# Patient Record
Sex: Male | Born: 1980 | Race: Black or African American | Hispanic: No | Marital: Single
Health system: Midwestern US, Community
[De-identification: ages and names within clinical notes are randomized; demographics above are authoritative.]

## PROBLEM LIST (undated history)

## (undated) DIAGNOSIS — I10 Essential (primary) hypertension: Secondary | ICD-10-CM

## (undated) DIAGNOSIS — F319 Bipolar disorder, unspecified: Secondary | ICD-10-CM

## (undated) DIAGNOSIS — Z86718 Personal history of other venous thrombosis and embolism: Secondary | ICD-10-CM

## (undated) HISTORY — PX: OTHER SURGICAL HISTORY: SHX169

## (undated) HISTORY — PX: FASCIOTOMY: SHX132

---

## 2011-09-01 DIAGNOSIS — N529 Male erectile dysfunction, unspecified: Secondary | ICD-10-CM | POA: Insufficient documentation

## 2011-09-01 DIAGNOSIS — E669 Obesity, unspecified: Secondary | ICD-10-CM | POA: Insufficient documentation

## 2011-09-01 DIAGNOSIS — Z8639 Personal history of other endocrine, nutritional and metabolic disease: Secondary | ICD-10-CM | POA: Insufficient documentation

## 2011-09-01 DIAGNOSIS — Z720 Tobacco use: Secondary | ICD-10-CM | POA: Insufficient documentation

## 2011-09-01 DIAGNOSIS — M19071 Primary osteoarthritis, right ankle and foot: Secondary | ICD-10-CM | POA: Insufficient documentation

## 2014-11-02 DIAGNOSIS — F312 Bipolar disorder, current episode manic severe with psychotic features: Secondary | ICD-10-CM | POA: Insufficient documentation

## 2014-11-02 DIAGNOSIS — F1210 Cannabis abuse, uncomplicated: Secondary | ICD-10-CM | POA: Insufficient documentation

## 2014-11-17 ENCOUNTER — Emergency Department (HOSPITAL_COMMUNITY)
Admission: EM | Admit: 2014-11-17 | Discharge: 2014-11-18 | Disposition: A | Discharge status: Other Acute Inpatient Hospital | Payer: Medicare Other | Attending: Emergency Medicine | Admitting: Emergency Medicine

## 2014-11-17 ENCOUNTER — Encounter (HOSPITAL_COMMUNITY): Payer: Self-pay

## 2014-11-17 DIAGNOSIS — F22 Delusional disorders: Secondary | ICD-10-CM | POA: Diagnosis not present

## 2014-11-17 DIAGNOSIS — R45850 Homicidal ideations: Secondary | ICD-10-CM | POA: Diagnosis present

## 2014-11-17 DIAGNOSIS — F1210 Cannabis abuse, uncomplicated: Secondary | ICD-10-CM | POA: Insufficient documentation

## 2014-11-17 DIAGNOSIS — Z86718 Personal history of other venous thrombosis and embolism: Secondary | ICD-10-CM | POA: Insufficient documentation

## 2014-11-17 DIAGNOSIS — I10 Essential (primary) hypertension: Secondary | ICD-10-CM | POA: Diagnosis not present

## 2014-11-17 DIAGNOSIS — F319 Bipolar disorder, unspecified: Secondary | ICD-10-CM | POA: Diagnosis not present

## 2014-11-17 DIAGNOSIS — Z79899 Other long term (current) drug therapy: Secondary | ICD-10-CM | POA: Diagnosis not present

## 2014-11-17 HISTORY — DX: Essential (primary) hypertension: I10

## 2014-11-17 HISTORY — DX: Bipolar disorder, unspecified: F31.9

## 2014-11-17 HISTORY — DX: Personal history of other venous thrombosis and embolism: Z86.718

## 2014-11-17 LAB — CBC
HEMATOCRIT: 45.4 % (ref 39.0–52.0)
Hemoglobin: 14.7 g/dL (ref 13.0–17.0)
MCH: 30.4 pg (ref 26.0–34.0)
MCHC: 32.4 g/dL (ref 30.0–36.0)
MCV: 93.8 fL (ref 78.0–100.0)
Platelets: 166 10*3/uL (ref 150–400)
RBC: 4.84 MIL/uL (ref 4.22–5.81)
RDW: 13.7 % (ref 11.5–15.5)
WBC: 9.4 10*3/uL (ref 4.0–10.5)

## 2014-11-17 LAB — COMPREHENSIVE METABOLIC PANEL
ALT: 68 U/L — ABNORMAL HIGH (ref 0–53)
AST: 32 U/L (ref 0–37)
Albumin: 3.7 g/dL (ref 3.5–5.2)
Alkaline Phosphatase: 37 U/L — ABNORMAL LOW (ref 39–117)
Anion gap: 11 (ref 5–15)
BILIRUBIN TOTAL: 0.7 mg/dL (ref 0.3–1.2)
BUN: 9 mg/dL (ref 6–23)
CHLORIDE: 99 mmol/L (ref 96–112)
CO2: 30 mmol/L (ref 19–32)
Calcium: 8.6 mg/dL (ref 8.4–10.5)
Creatinine, Ser: 1.33 mg/dL (ref 0.50–1.35)
GFR calc Af Amer: 81 mL/min — ABNORMAL LOW (ref >90)
GFR, EST NON AFRICAN AMERICAN: 70 mL/min — AB (ref >90)
Glucose, Bld: 97 mg/dL (ref 70–99)
Potassium: 3.7 mmol/L (ref 3.5–5.1)
SODIUM: 140 mmol/L (ref 135–145)
Total Protein: 6.3 g/dL (ref 6.0–8.3)

## 2014-11-17 LAB — ACETAMINOPHEN LEVEL

## 2014-11-17 LAB — SALICYLATE LEVEL

## 2014-11-17 LAB — ETHANOL: Alcohol, Ethyl (B): <5 mg/dL (ref 0–9)

## 2014-11-17 MED ORDER — ACETAMINOPHEN 325 MG PO TABS: 650.0000 mg | ORAL_TABLET | ORAL | Status: DC | PRN

## 2014-11-17 MED ORDER — IBUPROFEN 400 MG PO TABS: 600.0000 mg | ORAL_TABLET | Freq: Three times a day (TID) | ORAL | Status: DC | PRN

## 2014-11-17 NOTE — ED Notes (Signed)
Pt recent dx with bipolar, HI due to paranoia, sts he has been to [facility] and prefers to go back there.

## 2014-11-17 NOTE — ED Notes (Signed)
Patient placed in paper scrubs and wanded by security. 

## 2014-11-17 NOTE — ED Provider Notes (Signed)
CSN: [id number]     Arrival date & time 11/17/14  2104 History   First MD Initiated Contact with Patient 11/17/14 2214     Chief Complaint  Patient presents with  . Medical Clearance  . Homicidal     (Consider location/radiation/quality/duration/timing/severity/associated sxs/prior Treatment) HPI  34 year old male presents requesting admission to [facility]. The patient was discharged from there the last couple days and states he has not taken any of his medicines as prescribed and now is developing paranoia. He states he feels that multiple people are trying to kill him, he states he would not kill anyone intentionally but he thought someone was going to attack him that he would do it was necessary. Denies suicidal thoughts. Patient states before he went to [facility] the first time he had auditory hallucinations but states these are not present currently. Does not exactly he was discharged on the pain work shows Depakote and Zyprexa. Complains of chronic leg pain from fasciotomy when he was 19 but no other new pains or illness.  Past Medical History  Diagnosis Date  . Bipolar 1 disorder   . Hx of blood clots   . Hypertension    Past Surgical History  Procedure Laterality Date  . Fasciotomy     No family history on file. History  Substance Use Topics  . Smoking status: Heavy Tobacco Smoker  . Smokeless tobacco: Not on file  . Alcohol Use: Yes    Review of Systems  Musculoskeletal: Positive for arthralgias (chronic right leg pain).  Psychiatric/Behavioral: Negative for suicidal ideas, hallucinations and self-injury.       Paranoia  All other systems reviewed and are negative.     Allergies  Review of patient's allergies indicates no known allergies.  Home Medications   Prior to Admission medications   Medication Sig Start Date End Date Taking? Authorizing Provider  cyclobenzaprine (FLEXERIL) 10 MG tablet Take 10 mg by mouth 3 (three) times daily as needed for  muscle spasms.   Yes Historical Provider, MD  divalproex (DEPAKOTE ER) 500 MG 24 hr tablet Take 500 mg by mouth at bedtime.   Yes Historical Provider, MD  OLANZapine (ZYPREXA) 5 MG tablet Take 5 mg by mouth 3 (three) times daily.   Yes Historical Provider, MD   BP 139/86 mmHg  Pulse 110  Temp(Src) 98.1 F (36.7 C) (Oral)  Resp 18  Ht 6\' 7"  (2.007 m)  Wt 299 lb 4.8 oz (135.762 kg)  BMI 33.70 kg/m2  SpO2 97% Physical Exam  Constitutional: He is oriented to person, place, and time. He appears well-developed and well-nourished.  HENT:  Head: Normocephalic and atraumatic.  Right Ear: External ear normal.  Left Ear: External ear normal.  Nose: Nose normal.  Eyes: Right eye exhibits no discharge. Left eye exhibits no discharge.  Neck: Neck supple.  Cardiovascular: Normal rate, regular rhythm, normal heart sounds and intact distal pulses.   Pulmonary/Chest: Effort normal and breath sounds normal.  Abdominal: Soft. He exhibits no distension.  Musculoskeletal:  Right lower leg scar from prior fasciotomy  Neurological: He is alert and oriented to person, place, and time.  Skin: Skin is warm and dry.  Psychiatric: He is not actively hallucinating. Thought content is paranoid. He expresses no suicidal ideation. He expresses no suicidal plans.  Nursing note and vitals reviewed.   ED Course  Procedures (including critical care time) Labs Review Labs Reviewed  ACETAMINOPHEN LEVEL - Abnormal; Notable for the following:    Acetaminophen (Tylenol), Serum <  10.0 (*)    All other components within normal limits  COMPREHENSIVE METABOLIC PANEL - Abnormal; Notable for the following:    ALT 68 (*)    Alkaline Phosphatase 37 (*)    GFR calc non Af Amer 70 (*)    GFR calc Af Amer 81 (*)    All other components within normal limits  CBC  ETHANOL  SALICYLATE LEVEL  URINE RAPID DRUG SCREEN (HOSP PERFORMED)    Imaging Review No results found.   EKG Interpretation None      MDM   Final  diagnoses:  Paranoia    Patient is not actively psychotic but is complaining of paranoid thoughts. He is not aggressive or immediately to self or others. He is requesting admission to [facility]. Will consult psych.     Pricilla Loveless, MD 11/17/14 2308

## 2014-11-17 NOTE — ED Notes (Signed)
Telespych monitor at bedside.  

## 2014-11-17 NOTE — ED Notes (Signed)
Pt states that he has not been taking his meds since he was released from [facility] a few days ago. Pt states that he is from [place].

## 2014-11-17 NOTE — BH Assessment (Signed)
Tele Assessment Note   Steven Vasquez is a 34 y.o. male who voluntarily presents to [facility] with paranoid sxs and HI. P t has no specific person or plan/intent to harm anyone else and denies SI.  Pt reports the following he'd been d/c from [facility] 2 days ago and has not taken any meds because he's been smoking weed--"I think that marijuana is better than other medicinal drugs".  Pt says he smokes only 1 marijuana cigarette x2 days because he cannot afford any more.  Pt says he is paranoid towards "people who look at him" and feels those people are out to get him.  Pt says he will harm someone if he feels they are out to get him. Per Donell Sievert, PA, pt meets criteria for OBS Unit and can be reviewed in the morning for final decision for admittance.    Axis I: Bipolar I disorder; Cannabis use disorder, Mild Axis II: Deferred Axis III:  Past Medical History  Diagnosis Date  . Bipolar 1 disorder   . Hx of blood clots   . Hypertension    Axis IV: other psychosocial or environmental problems, problems related to social environment, problems with access to health care services and problems with primary support group Axis V: 31-40 impairment in reality testing  Past Medical History:  Past Medical History  Diagnosis Date  . Bipolar 1 disorder   . Hx of blood clots   . Hypertension     Past Surgical History  Procedure Laterality Date  . Fasciotomy      Family History: No family history on file.  Social History:  reports that he has been smoking.  He does not have any smokeless tobacco history on file. He reports that he drinks alcohol. He reports that he uses illicit drugs (Marijuana).  Additional Social History:  Alcohol / Drug Use Pain Medications: See MAR  Prescriptions: See MAR  Over the Counter: See MAR  History of alcohol / drug use?: Yes Longest period of sobriety (when/how long): None  Negative Consequences of Use: Work / Programmer, multimedia, Copywriter, advertising relationships, Surveyor, quantity Withdrawal  Symptoms: Other (Comment) (No w/d sxs ) Substance #1 Name of Substance 1: THC  1 - Age of First Use: Teens  1 - Amount (size/oz): 1 Joint  1 - Frequency: Daily  1 - Duration: 2 Days  1 - Last Use / Amount: 11/16/14  CIWA: CIWA-Ar BP: 128/86 mmHg Pulse Rate: 98 COWS:    PATIENT STRENGTHS: (choose at least two) Communication skills Motivation for treatment/growth  Allergies: No Known Allergies  Home Medications:  (Not in a hospital admission)  OB/GYN Status:  No LMP for male patient.  General Assessment Data Location of Assessment: [facility] ED Is this a Tele or Face-to-Face Assessment?: Tele Assessment Is this an Initial Assessment or a Re-assessment for this encounter?: Initial Assessment Living Arrangements: Alone Can pt return to current living arrangement?: Yes Admission Status: Voluntary Is patient capable of signing voluntary admission?: Yes Transfer from: Home Referral Source: Self/Family/Friend  Medical Screening Exam [facility] Walk-in ONLY) Medical Exam completed: No Reason for MSE not completed: Other:  [facility] Crisis Care Plan Living Arrangements: Alone Name of Psychiatrist: None  Name of Therapist: None   Education Status Is patient currently in school?: No Current Grade: None  Highest grade of school patient has completed: None  Name of school: None  Contact person: None   Risk to self with the past 6 months Suicidal Ideation: No Suicidal Intent: No Is patient at risk for  suicide?: No Suicidal Plan?: No Access to Means: No What has been your use of drugs/alcohol within the last 12 months?: THC  Previous Attempts/Gestures: No How many times?: 0 Other Self Harm Risks: None  Triggers for Past Attempts: None known Intentional Self Injurious Behavior: None Family Suicide History: No Recent stressful life event(s): Other (Comment), Recent negative physical changes (recent d/c from [facility] ) Persecutory voices/beliefs?: No Depression: Yes Depression Symptoms:   (None reported ) Substance abuse history and/or treatment for substance abuse?: No Suicide prevention information given to non-admitted patients: Not applicable  Risk to Others within the past 6 months Homicidal Ideation: Yes-Currently Present Thoughts of Harm to Others: Yes-Currently Present Comment - Thoughts of Harm to Others: Due to paranoia  Current Homicidal Intent: Yes-Currently Present Current Homicidal Plan: No Access to Homicidal Means: No Identified Victim: None  History of harm to others?: No Assessment of Violence: None Noted Violent Behavior Description: None  Does patient have access to weapons?: No Criminal Charges Pending?: No Describe Pending Criminal Charges: None  Does patient have a court date: No  Psychosis Hallucinations: None noted Delusions: Unspecified  Mental Status Report Appearance/Hygiene: Disheveled, In scrubs Eye Contact: Good Motor Activity: Unremarkable Speech: Logical/coherent Level of Consciousness: Alert Mood: Suspicious, Preoccupied Affect: Preoccupied Anxiety Level: None Thought Processes: Coherent, Relevant Judgement: Impaired Orientation: Person, Place, Time, Situation Obsessive Compulsive Thoughts/Behaviors: None  Cognitive Functioning Concentration: Decreased Memory: Recent Intact, Remote Intact IQ: Average Insight: Poor Impulse Control: Fair Appetite: Good Weight Loss: 0 Weight Gain: 0 Sleep: No Change Total Hours of Sleep: 7 Vegetative Symptoms: None  ADLScreening [facility] Assessment Services) Patient's cognitive ability adequate to safely complete daily activities?: Yes Patient able to express need for assistance with ADLs?: Yes Independently performs ADLs?: Yes (appropriate for developmental age)  Prior Inpatient Therapy Prior Inpatient Therapy: Yes Prior Therapy Dates: 2016 Prior Therapy Facilty/Provider(s): [facility]  Reason for Treatment: Paranoia/HI   Prior Outpatient Therapy Prior Outpatient Therapy: No Prior  Therapy Dates: None  Prior Therapy Facilty/Provider(s): None  Reason for Treatment: None   ADL Screening (condition at time of admission) Patient's cognitive ability adequate to safely complete daily activities?: Yes Is the patient deaf or have difficulty hearing?: No Does the patient have difficulty seeing, even when wearing glasses/contacts?: No Does the patient have difficulty concentrating, remembering, or making decisions?: Yes Patient able to express need for assistance with ADLs?: Yes Does the patient have difficulty dressing or bathing?: No Independently performs ADLs?: Yes (appropriate for developmental age) Does the patient have difficulty walking or climbing stairs?: No Weakness of Legs: None Weakness of Arms/Hands: None  Home Assistive Devices/Equipment Home Assistive Devices/Equipment: None  Therapy Consults (therapy consults require a physician order) PT Evaluation Needed: No OT Evalulation Needed: No SLP Evaluation Needed: No Abuse/Neglect Assessment (Assessment to be complete while patient is alone) Physical Abuse: Denies Verbal Abuse: Denies Sexual Abuse: Denies Exploitation of patient/patient's resources: Denies Self-Neglect: Denies Values / Beliefs Cultural Requests During Hospitalization: None Spiritual Requests During Hospitalization: None Consults Spiritual Care Consult Needed: No Social Work Consult Needed: No Merchant navy officer (For Healthcare) Does patient have an advance directive?: No Would patient like information on creating an advanced directive?: No - patient declined information    Additional Information 1:1 In Past 12 Months?: No CIRT Risk: No Elopement Risk: No Does patient have medical clearance?: Yes     Disposition:  Disposition Initial Assessment Completed for this Encounter: Yes Disposition of Patient: Referred to (Per Donell Sievert, PA meets criteria for OBS unit) Patient  referred to: Other (Comment) (Per Donell SievertSpencer Simon, PA  meets criteria for OBS Unit )  Murrell ReddenSimmons, Jaquesha Boroff C 11/17/2014 11:57 PM

## 2014-11-18 ENCOUNTER — Encounter (HOSPITAL_COMMUNITY): Payer: Self-pay | Admitting: *Deleted

## 2014-11-18 ENCOUNTER — Observation Stay (HOSPITAL_COMMUNITY)
Admission: EM | Admit: 2014-11-18 | Discharge: 2014-11-19 | Disposition: A | Discharge status: Home / Self Care | Payer: Medicare Other | Source: Intra-hospital | Attending: Psychiatry | Admitting: Psychiatry

## 2014-11-18 DIAGNOSIS — F319 Bipolar disorder, unspecified: Secondary | ICD-10-CM

## 2014-11-18 DIAGNOSIS — F17210 Nicotine dependence, cigarettes, uncomplicated: Secondary | ICD-10-CM | POA: Insufficient documentation

## 2014-11-18 DIAGNOSIS — F1210 Cannabis abuse, uncomplicated: Secondary | ICD-10-CM | POA: Diagnosis not present

## 2014-11-18 DIAGNOSIS — F1994 Other psychoactive substance use, unspecified with psychoactive substance-induced mood disorder: Secondary | ICD-10-CM | POA: Diagnosis not present

## 2014-11-18 DIAGNOSIS — F22 Delusional disorders: Secondary | ICD-10-CM | POA: Diagnosis present

## 2014-11-18 DIAGNOSIS — F39 Unspecified mood [affective] disorder: Secondary | ICD-10-CM | POA: Diagnosis not present

## 2014-11-18 DIAGNOSIS — R45850 Homicidal ideations: Secondary | ICD-10-CM | POA: Diagnosis present

## 2014-11-18 DIAGNOSIS — I10 Essential (primary) hypertension: Secondary | ICD-10-CM | POA: Diagnosis not present

## 2014-11-18 LAB — RAPID URINE DRUG SCREEN, HOSP PERFORMED
Amphetamines: NOT DETECTED
BARBITURATES: NOT DETECTED
BENZODIAZEPINES: NOT DETECTED
COCAINE: NOT DETECTED
Opiates: NOT DETECTED
Tetrahydrocannabinol: POSITIVE — AB

## 2014-11-18 LAB — VALPROIC ACID LEVEL

## 2014-11-18 MED ORDER — TRAZODONE HCL 50 MG PO TABS
50.0000 mg | ORAL_TABLET | Freq: Every evening | ORAL | Status: DC | PRN
  Administered 2014-11-18: 50 mg via ORAL
  Filled 2014-11-18 (×3): qty 1

## 2014-11-18 MED ORDER — MAGNESIUM HYDROXIDE 400 MG/5ML PO SUSP: 30.0000 mL | Freq: Every day | ORAL | Status: DC | PRN

## 2014-11-18 MED ORDER — ALUM & MAG HYDROXIDE-SIMETH 200-200-20 MG/5ML PO SUSP: 30.0000 mL | ORAL | Status: DC | PRN

## 2014-11-18 MED ORDER — ACETAMINOPHEN 325 MG PO TABS
650.0000 mg | ORAL_TABLET | Freq: Four times a day (QID) | ORAL | Status: DC | PRN
  Administered 2014-11-18: 650 mg via ORAL
  Filled 2014-11-18: qty 2

## 2014-11-18 MED ORDER — HYDROXYZINE HCL 25 MG PO TABS: 25.0000 mg | ORAL_TABLET | Freq: Four times a day (QID) | ORAL | Status: DC | PRN

## 2014-11-18 MED ORDER — CYCLOBENZAPRINE HCL 5 MG PO TABS
10.0000 mg | ORAL_TABLET | Freq: Every day | ORAL | Status: DC | PRN
  Administered 2014-11-18 – 2014-11-19 (×2): 10 mg via ORAL
  Filled 2014-11-18 (×2): qty 2

## 2014-11-18 MED ORDER — OLANZAPINE 5 MG PO TABS
5.0000 mg | ORAL_TABLET | Freq: Three times a day (TID) | ORAL | Status: DC
  Administered 2014-11-18 – 2014-11-19 (×4): 5 mg via ORAL
  Filled 2014-11-18 (×8): qty 1

## 2014-11-18 MED ORDER — DIVALPROEX SODIUM ER 500 MG PO TB24
500.0000 mg | ORAL_TABLET | Freq: Every day | ORAL | Status: DC
  Administered 2014-11-18: 500 mg via ORAL
  Filled 2014-11-18 (×2): qty 1

## 2014-11-18 NOTE — Progress Notes (Signed)
Pt presents to Observation unit alert and cooperative. Pt presented to ED reporting +HI, paranoia and requesting to go back to [facility]. Pt reports being discharged from there a few days ago. Currently denies SI/HI "only if someone is trying to hurt me, I'm paranoid", verbally contracts for safety. -A/[place]. Admits to being noncompliant with medication and smoking marijuana. Emotional support and encouragement given. Pt admitted for evaluation, stabilization and reduction of baseline. Will monitor closely.

## 2014-11-18 NOTE — Progress Notes (Addendum)
Counselor found a list of resources for the homeless population in [place] Pt was asleep and was not given the information.Counselor will pass this information off to oncoming day shift counselor.   Ardelle ParkLatoya McNeil, MA OBS Counselor

## 2014-11-18 NOTE — H&P (Signed)
Psychiatric Observation Unit Admission Assessment Adult  Patient Identification: Steven Vasquez MRN:  [id number] Date of Evaluation:  11/18/2014 Chief Complaint:  "I'm paranoid." Principal Diagnosis: Bipolar Disorder Diagnosis:   Patient Active Problem List   Diagnosis Date Noted  . Substance induced mood disorder [F19.94] 11/18/2014   History of Present Illness:: Steven Vasquez is a 34 yo [place] American male who presented to [facility] ED on 11/17/2014 for evaluation of paranoia and homicidal ideation. He does not identify any particular person whom he has plan/intent to harm. He denies suicidal ideation. He was discharged from [facility] 2 days ago where he was hospitalized for 9 days for bipolar disorder. He states he has not taken his medication (Zyprexa and Depakote) since hospital discharge. He reports he has been smoking marijuana and that his paranoia has increased over the past 48 hours. He states he stopped taking Zyprexa and Depakote "because I felt like I didn't need it. I was not hearing the voice." He describes his paranoia as "somebody watching me." He reports hearing voices intermittently; the last time he heard voices he was sitting at the bank and he saw an individual walking into the bank "with a blue money bag" and states he heard the voice say "there goes the money I promised you right there." He states he has been living in the area with his girlfriend but states he does not want to return there. He states he is from [place] and his plan is to return to [place]. He does not have an outpatient provider currently. He was scheduled to follow up with [facility] on April 15th, however, he states he did not go to this appointment "because I didn't have a ride."   Elements:  Location:  Mood. Quality:  Paranoia. Severity:  Moderate. Timing:  Acute. Duration:  onset 2 days ago. Context:  stressors, substance abuse. Associated Signs/Symptoms: Depression Symptoms:  fatigue, anxiety, disturbed  sleep, (Hypo) Manic Symptoms:  Distractibility, Anxiety Symptoms:  N/A Psychotic Symptoms:  Paranoia, PTSD Symptoms: NA Total Time spent with patient: 45 minutes  Past Medical History:  Past Medical History  Diagnosis Date  . Bipolar 1 disorder   . Hx of blood clots   . Hypertension     Past Surgical History  Procedure Laterality Date  . Fasciotomy     Family History: History reviewed. No pertinent family history. Social History:  History  Alcohol Use  . Yes     History  Drug Use  . Yes  . Special: Marijuana    History   Social History  . Marital Status: Married    Spouse Name: N/A  . Number of Children: N/A  . Years of Education: N/A   Social History Main Topics  . Smoking status: Heavy Tobacco Smoker  . Smokeless tobacco: Not on file  . Alcohol Use: Yes  . Drug Use: Yes    Special: Marijuana  . Sexual Activity: Not on file   Other Topics Concern  . None   Social History Narrative   Additional Social History:    Pain Medications: See MAR  Prescriptions: See MAR  Over the Counter: See MAR  History of alcohol / drug use?: Yes Longest period of sobriety (when/how long): None  Negative Consequences of Use: Work / Youth worker, Charity fundraiser relationships, Museum/gallery curator Name of Substance 1: THC  1 - Age of First Use: Teens  1 - Amount (size/oz): 1 Joint  1 - Frequency: Daily  1 - Duration: 2 Days  1 - Last Use /  Amount: 11/16/14    Musculoskeletal: Strength & Muscle Tone: within normal limits Gait & Station: normal Patient leans: N/A  Psychiatric Specialty Exam: Physical Exam  Nursing note and vitals reviewed. Constitutional: He is oriented to person, place, and time. He appears well-developed and well-nourished.  HENT:  Head: Normocephalic.  Neck: Normal range of motion.  Genitourinary:  Deferred - no subjective complaints  Musculoskeletal: Normal range of motion.  Neurological: He is alert and oriented to person, place, and time.  Skin: Skin is warm  and dry.  Psychiatric:  See PSE    Review of Systems  Psychiatric/Behavioral: The patient is nervous/anxious.        Rates paranoia 15/10    Blood pressure 119/80, pulse 84, temperature 97.6 F (36.4 C), temperature source Oral, resp. rate 18, height $RemoveBe'6\' 7"'xCCSChViY$  (2.007 m), weight 135.626 kg (299 lb).Body mass index is 33.67 kg/(m^2).  General Appearance: Fairly Groomed  Engineer, water::  Good  Speech:  Clear and Coherent and Normal Rate  Volume:  Normal  Mood:  Anxious  Affect:  Congruent  Thought Process:  Coherent  Orientation:  Full (Time, Place, and Person)  Thought Content:  Paranoid Ideation  Suicidal Thoughts:  No  Homicidal Thoughts:  Yes.  without intent/plan  Memory:  Immediate;   Good Recent;   Good Remote;   Good  Judgement:  Intact  Insight:  Fair  Psychomotor Activity:  Normal  Concentration:  Good  Recall:  Good  Fund of Knowledge:Good  Language: Good  Akathisia:  No  Handed:  Right  AIMS (if indicated):     Assets:  Communication Skills Desire for Improvement Physical Health Resilience  ADL's:  Intact  Cognition: WNL  Sleep:      Risk to Self: Is patient at risk for suicide?: No Risk to Others:   Prior Inpatient Therapy:   Prior Outpatient Therapy:    Alcohol Screening: 1. How often do you have a drink containing alcohol?: 2 to 4 times a month 2. How many drinks containing alcohol do you have on a typical day when you are drinking?: 1 or 2 3. How often do you have six or more drinks on one occasion?: Less than monthly Preliminary Score: 1 4. How often during the last year have you found that you were not able to stop drinking once you had started?: Never 5. How often during the last year have you failed to do what was normally expected from you becasue of drinking?: Never 6. How often during the last year have you needed a first drink in the morning to get yourself going after a heavy drinking session?: Never 7. How often during the last year have you had  a feeling of guilt of remorse after drinking?: Never 8. How often during the last year have you been unable to remember what happened the night before because you had been drinking?: Never 9. Have you or someone else been injured as a result of your drinking?: No 10. Has a relative or friend or a doctor or another health worker been concerned about your drinking or suggested you cut down?: No Alcohol Use Disorder Identification Test Final Score (AUDIT): 3 Brief Intervention: AUDIT score less than 7 or less-screening does not suggest unhealthy drinking-brief intervention not indicated  Allergies:  No Known Allergies Lab Results:  Results for orders placed or performed during the hospital encounter of 11/17/14 (from the past 48 hour(s))  Acetaminophen level     Status: Abnormal   Collection Time:  11/17/14  9:31 PM  Result Value Ref Range   Acetaminophen (Tylenol), Serum <10.0 (L) 10 - 30 ug/mL    Comment:        THERAPEUTIC CONCENTRATIONS VARY SIGNIFICANTLY. A RANGE OF 10-30 ug/mL MAY BE AN EFFECTIVE CONCENTRATION FOR MANY PATIENTS. HOWEVER, SOME ARE BEST TREATED AT CONCENTRATIONS OUTSIDE THIS RANGE. ACETAMINOPHEN CONCENTRATIONS >150 ug/mL AT 4 HOURS AFTER INGESTION AND >50 ug/mL AT 12 HOURS AFTER INGESTION ARE OFTEN ASSOCIATED WITH TOXIC REACTIONS.   CBC     Status: None   Collection Time: 11/17/14  9:31 PM  Result Value Ref Range   WBC 9.4 4.0 - 10.5 K/uL   RBC 4.84 4.22 - 5.81 MIL/uL   Hemoglobin 14.7 13.0 - 17.0 g/dL   HCT 45.4 39.0 - 52.0 %   MCV 93.8 78.0 - 100.0 fL   MCH 30.4 26.0 - 34.0 pg   MCHC 32.4 30.0 - 36.0 g/dL   RDW 13.7 11.5 - 15.5 %   Platelets 166 150 - 400 K/uL  Comprehensive metabolic panel     Status: Abnormal   Collection Time: 11/17/14  9:31 PM  Result Value Ref Range   Sodium 140 135 - 145 mmol/L   Potassium 3.7 3.5 - 5.1 mmol/L   Chloride 99 96 - 112 mmol/L   CO2 30 19 - 32 mmol/L   Glucose, Bld 97 70 - 99 mg/dL   BUN 9 6 - 23 mg/dL    Creatinine, Ser 1.33 0.50 - 1.35 mg/dL   Calcium 8.6 8.4 - 10.5 mg/dL   Total Protein 6.3 6.0 - 8.3 g/dL   Albumin 3.7 3.5 - 5.2 g/dL   AST 32 0 - 37 U/L   ALT 68 (H) 0 - 53 U/L   Alkaline Phosphatase 37 (L) 39 - 117 U/L   Total Bilirubin 0.7 0.3 - 1.2 mg/dL   GFR calc non Af Amer 70 (L) >90 mL/min   GFR calc Af Amer 81 (L) >90 mL/min    Comment: (NOTE) The eGFR has been calculated using the CKD EPI equation. This calculation has not been validated in all clinical situations. eGFR's persistently <90 mL/min signify possible Chronic Kidney Disease.    Anion gap 11 5 - 15  Ethanol (ETOH)     Status: None   Collection Time: 11/17/14  9:31 PM  Result Value Ref Range   Alcohol, Ethyl (B) <5 0 - 9 mg/dL    Comment:        LOWEST DETECTABLE LIMIT FOR SERUM ALCOHOL IS 11 mg/dL FOR MEDICAL PURPOSES ONLY   Salicylate level     Status: None   Collection Time: 11/17/14  9:31 PM  Result Value Ref Range   Salicylate Lvl <7.3 2.8 - 20.0 mg/dL  Urine Drug Screen     Status: Abnormal   Collection Time: 11/18/14  1:50 AM  Result Value Ref Range   Opiates NONE DETECTED NONE DETECTED   Cocaine NONE DETECTED NONE DETECTED   Benzodiazepines NONE DETECTED NONE DETECTED   Amphetamines NONE DETECTED NONE DETECTED   Tetrahydrocannabinol POSITIVE (A) NONE DETECTED   Barbiturates NONE DETECTED NONE DETECTED    Comment:        DRUG SCREEN FOR MEDICAL PURPOSES ONLY.  IF CONFIRMATION IS NEEDED FOR ANY PURPOSE, NOTIFY LAB WITHIN 5 DAYS.        LOWEST DETECTABLE LIMITS FOR URINE DRUG SCREEN Drug Class       Cutoff (ng/mL) Amphetamine      1000 Barbiturate  200 Benzodiazepine   185 Tricyclics       631 Opiates          300 Cocaine          300 THC              50    Current Medications: Current Facility-Administered Medications  Medication Dose Route Frequency Provider Last Rate Last Dose  . acetaminophen (TYLENOL) tablet 650 mg  650 mg Oral Q6H PRN Laverle Hobby, PA-C   650 mg at  11/18/14 1205  . alum & mag hydroxide-simeth (MAALOX/MYLANTA) 200-200-20 MG/5ML suspension 30 mL  30 mL Oral Q4H PRN Laverle Hobby, PA-C      . cyclobenzaprine (FLEXERIL) tablet 10 mg  10 mg Oral Daily PRN Lurena Nida, NP   10 mg at 11/18/14 1630  . divalproex (DEPAKOTE ER) 24 hr tablet 500 mg  500 mg Oral QHS Maurine Minister Simon, PA-C      . hydrOXYzine (ATARAX/VISTARIL) tablet 25 mg  25 mg Oral Q6H PRN Laverle Hobby, PA-C      . magnesium hydroxide (MILK OF MAGNESIA) suspension 30 mL  30 mL Oral Daily PRN Laverle Hobby, PA-C      . OLANZapine (ZYPREXA) tablet 5 mg  5 mg Oral TID Laverle Hobby, PA-C   5 mg at 11/18/14 1631  . traZODone (DESYREL) tablet 50 mg  50 mg Oral QHS,MR X 1 Spencer E Simon, PA-C       PTA Medications: Prescriptions prior to admission  Medication Sig Dispense Refill Last Dose  . cyclobenzaprine (FLEXERIL) 10 MG tablet Take 10 mg by mouth 3 (three) times daily as needed for muscle spasms.   11/16/2014 at Unknown time  . divalproex (DEPAKOTE ER) 500 MG 24 hr tablet Take 500 mg by mouth at bedtime.   Past Week at Unknown time  . OLANZapine (ZYPREXA) 5 MG tablet Take 5 mg by mouth 3 (three) times daily.   Past Week at Unknown time    Previous Psychotropic Medications: Yes   Substance Abuse History in the last 12 months:  Yes.    Consequences of Substance Abuse: Substance induced mood disorder  Results for orders placed or performed during the hospital encounter of 11/17/14 (from the past 72 hour(s))  Acetaminophen level     Status: Abnormal   Collection Time: 11/17/14  9:31 PM  Result Value Ref Range   Acetaminophen (Tylenol), Serum <10.0 (L) 10 - 30 ug/mL    Comment:        THERAPEUTIC CONCENTRATIONS VARY SIGNIFICANTLY. A RANGE OF 10-30 ug/mL MAY BE AN EFFECTIVE CONCENTRATION FOR MANY PATIENTS. HOWEVER, SOME ARE BEST TREATED AT CONCENTRATIONS OUTSIDE THIS RANGE. ACETAMINOPHEN CONCENTRATIONS >150 ug/mL AT 4 HOURS AFTER INGESTION AND >50 ug/mL AT  12 HOURS AFTER INGESTION ARE OFTEN ASSOCIATED WITH TOXIC REACTIONS.   CBC     Status: None   Collection Time: 11/17/14  9:31 PM  Result Value Ref Range   WBC 9.4 4.0 - 10.5 K/uL   RBC 4.84 4.22 - 5.81 MIL/uL   Hemoglobin 14.7 13.0 - 17.0 g/dL   HCT 45.4 39.0 - 52.0 %   MCV 93.8 78.0 - 100.0 fL   MCH 30.4 26.0 - 34.0 pg   MCHC 32.4 30.0 - 36.0 g/dL   RDW 13.7 11.5 - 15.5 %   Platelets 166 150 - 400 K/uL  Comprehensive metabolic panel     Status: Abnormal   Collection Time: 11/17/14  9:31 PM  Result Value Ref Range   Sodium 140 135 - 145 mmol/L   Potassium 3.7 3.5 - 5.1 mmol/L   Chloride 99 96 - 112 mmol/L   CO2 30 19 - 32 mmol/L   Glucose, Bld 97 70 - 99 mg/dL   BUN 9 6 - 23 mg/dL   Creatinine, Ser 0.05 0.50 - 1.35 mg/dL   Calcium 8.6 8.4 - 65.7 mg/dL   Total Protein 6.3 6.0 - 8.3 g/dL   Albumin 3.7 3.5 - 5.2 g/dL   AST 32 0 - 37 U/L   ALT 68 (H) 0 - 53 U/L   Alkaline Phosphatase 37 (L) 39 - 117 U/L   Total Bilirubin 0.7 0.3 - 1.2 mg/dL   GFR calc non Af Amer 70 (L) >90 mL/min   GFR calc Af Amer 81 (L) >90 mL/min    Comment: (NOTE) The eGFR has been calculated using the CKD EPI equation. This calculation has not been validated in all clinical situations. eGFR's persistently <90 mL/min signify possible Chronic Kidney Disease.    Anion gap 11 5 - 15  Ethanol (ETOH)     Status: None   Collection Time: 11/17/14  9:31 PM  Result Value Ref Range   Alcohol, Ethyl (B) <5 0 - 9 mg/dL    Comment:        LOWEST DETECTABLE LIMIT FOR SERUM ALCOHOL IS 11 mg/dL FOR MEDICAL PURPOSES ONLY   Salicylate level     Status: None   Collection Time: 11/17/14  9:31 PM  Result Value Ref Range   Salicylate Lvl <4.0 2.8 - 20.0 mg/dL  Urine Drug Screen     Status: Abnormal   Collection Time: 11/18/14  1:50 AM  Result Value Ref Range   Opiates NONE DETECTED NONE DETECTED   Cocaine NONE DETECTED NONE DETECTED   Benzodiazepines NONE DETECTED NONE DETECTED   Amphetamines NONE DETECTED  NONE DETECTED   Tetrahydrocannabinol POSITIVE (A) NONE DETECTED   Barbiturates NONE DETECTED NONE DETECTED    Comment:        DRUG SCREEN FOR MEDICAL PURPOSES ONLY.  IF CONFIRMATION IS NEEDED FOR ANY PURPOSE, NOTIFY LAB WITHIN 5 DAYS.        LOWEST DETECTABLE LIMITS FOR URINE DRUG SCREEN Drug Class       Cutoff (ng/mL) Amphetamine      1000 Barbiturate      200 Benzodiazepine   200 Tricyclics       300 Opiates          300 Cocaine          300 THC              50     Observation Level/Precautions:  Continuous Observation  Laboratory:  CBC Chemistry Profile UDS ETOH, Salicylate, Acetaminophen, Valproic acid  Psychotherapy:    Medications:  Zyprexa, Depakote  Consultations:  None  Discharge Concerns:  Housing  Estimated LOS: less than 24 hours  Other:     Psychological Evaluations: No   Treatment Plan Summary: Medication management - restart Zyprexa and Depakote Request records from recent admission from [facility] Re-evaluate in the morning for final disposition.   Medical Decision Making:  Established Problem, Stable/Improving (1), Review of Psycho-Social Stressors (1), Review or order clinical lab tests (1), Decision to obtain old records (1), Review and summation of old records (2), Review or order medicine tests (1), Review of Medication Regimen & Side Effects (2) and Review of New Medication or Change in Dosage (2)  Serena Colonel, FNP-BC [facility] 4/20/20168:19 PM

## 2014-11-18 NOTE — Progress Notes (Signed)
Counselor met with pt to address his needs. Pt reports that he is experiencing paranoia and he was upset with his girlfriend because she was not actively listening to him.  Pt reports that he wanted to physically hurt his girlfriend due to her not listening to him. Pt reports that he is currently homeless, but does no want to seek placement here in [place] and that he would like to move back to his home state of [place] [facility] Pt shared that he just needs to get out of [place] because he feels that he is being followed and watched. Pt states that he is always looking over his shoulder and rates his paranoia on a scale of 1-10, he ranks it a (15) and he states that he feels like he is about to go crazy. Pt informed Counselor that he would like to receive assistance with relocating back to [facility]. Counselor informed Pt that she may be able to find a local shelter for him, but not sure about assisting him with getting back to [facility]. Pt shared that he does not want to stay at a local shelter and that he just wants to get back to [facility].  Pt informed counselor that he was tired and wanted to try and get some rest. Pt was cooperative and calm during this process. Counselor will continue to provide support and encouragement while pt is in OBS unit. Counselor will continue to try and process with pt in the morning prior to the end of shift.  Redmond Pulling, MA OBS Unit

## 2014-11-18 NOTE — Progress Notes (Signed)
[facility] Assessment Progress Note   Counselor called [facility] to check bed availability per request from Drenda FreezeFran, NP. [facility] indicated that they had one geriatric male bed only. Counselor informed Drenda FreezeFran of this information.   Marcelle SmilingSamantha Angeldejesus Callaham, MS OBS Counselor

## 2014-11-18 NOTE — Progress Notes (Signed)
Patient ID: Steven FrederickCasey Vasquez, male   DOB: 04/07/1981, 34 y.o.   MRN: [id number] D-On awakening discussed with him plans after today. He states [place] homeless [place] And would like to return. He has been living in this area for about 6 years and doesn't like it, associates his paranoia with [place], not with THC use or his lifestyle. He states his family will help him get back there, but not forthcoming on the details. A-AM meds given, states he has not been medicated in the past though just out of [facility] two days ago. No complaints at this time.Showered and ate breakfast. Pleasant and cooperative. Verbal and appropriate. R-Waiting on Conrad NP to see him to make disposition recommendations. He complains of pain of 8 in R leg post blood clot and surgery years ago, no pain med available to give him other than Tylenol, and it is ineffective per his report.

## 2014-11-19 DIAGNOSIS — F39 Unspecified mood [affective] disorder: Secondary | ICD-10-CM | POA: Diagnosis not present

## 2014-11-19 DIAGNOSIS — F1994 Other psychoactive substance use, unspecified with psychoactive substance-induced mood disorder: Secondary | ICD-10-CM | POA: Diagnosis not present

## 2014-11-19 MED ORDER — DIVALPROEX SODIUM ER 500 MG PO TB24: 500.0000 mg | ORAL_TABLET | Freq: Every day | ORAL | Status: DC

## 2014-11-19 MED ORDER — OLANZAPINE 5 MG PO TABS: 5.0000 mg | ORAL_TABLET | Freq: Three times a day (TID) | ORAL | Status: DC

## 2014-11-19 NOTE — Progress Notes (Signed)
Nursing discharge note:  Patient discharged per NP order.  Patient received all personal belongings and a prescription for his medication.  Patient was irate that he was being discharged stating, "I'm crazy.  I need to be in a facility."  Patient denies SI/HI/AVH.  Reviewed discharge instructions and follow up information with him.  Patient indicated understanding. Patient left ambulatory with his ex-girlfriend.

## 2014-11-19 NOTE — Progress Notes (Signed)
[facility] Assessment Progress Note       Counselor received call from pt's wife, Kem ParkinsonZerrickia Sobek [phone]). Pt gave counselor permission to speak with her about his disposition. Counselor verified with wife information given to her by pt, that patient would be d/c and [facility]. Zerrickia called pt's girlfriend, Betsey Amenbony Fitzgerald [phone]), to have her to pick pt up and [facility]. Ebony agreed to do so.   Counselor advised pt as to Ebony's intention to pick him up and [facility] and he was in agreement with this plan. Counselor [facility].   Marcelle SmilingSamantha Corah Willeford, MS Counselor

## 2014-11-19 NOTE — Discharge Summary (Signed)
Physician Discharge Summary Note  Patient:  Steven Vasquez is an 34 y.o., male MRN:  [id number] DOB:  11/20/1980 Patient phone:  [phone] (home)  [place]   [place] [id number],  Total Time spent with patient: 20 minutes  Date of Admission:  11/18/2014 Date of Discharge: 11/19/2014  Reason for Admission:  Paranoia   Principal Problem: Substance induced mood disorder Discharge Diagnoses: Patient Active Problem List   Diagnosis Date Noted  . Substance induced mood disorder [F19.94] 11/18/2014    Musculoskeletal: Strength & Muscle Tone: within normal limits Gait & Station: normal Patient leans: N/A  Psychiatric Specialty Exam: Physical Exam  Nursing note and vitals reviewed. Constitutional: He is oriented to person, place, and time. He appears well-developed and well-nourished.  Neurological: He is alert and oriented to person, place, and time.  Skin: Skin is warm and dry.    Review of Systems  Psychiatric/Behavioral: Negative for suicidal ideas.    Blood pressure 118/74, pulse 85, temperature 98 F (36.7 C), temperature source Oral, resp. rate 18, height 6\' 7"  (2.007 m), weight 135.626 kg (299 lb).Body mass index is 33.67 kg/(m^2).  General Appearance: Fairly Groomed  Patent attorneyye Contact::  Good  Speech:  Clear and Coherent and Normal Rate  Volume:  Normal  Mood:  Anxious  Affect:  Congruent  Thought Process:  Coherent  Orientation:  Full (Time, Place, and Person)  Thought Content:  no psychotic symptoms  Suicidal Thoughts:  No  Homicidal Thoughts:  No  Memory:  Immediate;   Good Recent;   Good Remote;   Good  Judgement:  Fair  Insight:  Fair  Psychomotor Activity:  Normal  Concentration:  Good  Recall:  Good  Fund of Knowledge:Good  Language: Good  Akathisia:  No  Handed:  Right  AIMS (if indicated):     Assets:  Communication Skills Desire for Improvement Physical Health Resilience  ADL's:  Intact  Cognition: WNL  Sleep:      Past  Medical History:  Past Medical History  Diagnosis Date  . Bipolar 1 disorder   . Hx of blood clots   . Hypertension     Past Surgical History  Procedure Laterality Date  . Fasciotomy     Family History: History reviewed. No pertinent family history. Social History:  History  Alcohol Use  . Yes     History  Drug Use  . Yes  . Special: Marijuana    History   Social History  . Marital Status: Married    Spouse Name: N/A  . Number of Children: N/A  . Years of Education: N/A   Social History Main Topics  . Smoking status: Heavy Tobacco Smoker  . Smokeless tobacco: Not on file  . Alcohol Use: Yes  . Drug Use: Yes    Special: Marijuana  . Sexual Activity: Not on file   Other Topics Concern  . None   Social History Narrative    Past Psychiatric History: Hospitalizations:  Outpatient Care:  Substance Abuse Care:  Self-Mutilation:  Suicidal Attempts:  Violent Behaviors:   Risk to Self: Is patient at risk for suicide?: No Risk to Others:   Prior Inpatient Therapy:   Prior Outpatient Therapy:    Level of Care:  OP  Hospital Course:  Steven Vasquez is a 34 yo [place] male [facility] for evaluation of paranoia and homicidal ideation.  He was discharged [facility] on 11/12/14 after being hospitalized for 9 days for  bipolar disorder. He had been noncompliant with medication regimen that was started during his hospitalization. He had been smoking marijuana and reported his paranoia increased. He did not follow up with [facility] as scheduled after [facility] discharge. He was brought to the [facility] Observation unit for monitoring. He was under continuous observation. He was started on  Zyprexa 5 mg PO TID for mood stabilization and Depakote ER 500 mg PO QHS for mood stabilization. A valproic acid level was drawn on 11/18/14 when revealed he was subtherapeutic. On assessment on 11/19/14, he had a reduction in paranoia stating he only experiences  paranoia "only when I'm outside, not in the hospital." He was able to contract for safety while in the OBS unit. His goal is to get home to [place], however, he does not get pain until May 3rd.  Social work assisted with discharge planning. Patient agreeable to going to [facility] for assistance with housing until his disability check arrives. He agrees to continue current medications. Rxs for Zyprexa and Depakote given at time of discharge. He will be transported to [facility] by his friend.  At time of discharge, he denies suicidal or homicidal ideation, intent or plan. He denies AVH.   Consults:  None  Significant Diagnostic Studies:  labs: Valproic acid level, CMP, CBC, Salicylate, Acetaminophen, UDS  Discharge Vitals:   Blood pressure 118/74, pulse 85, temperature 98 F (36.7 C), temperature source Oral, resp. rate 18, height  (2.007 m), weight 135.626 kg (299 lb). Body mass index is 33.67 kg/(m^2). Lab Results:   Results for orders placed or performed during the hospital encounter of 11/18/14 (from the past 72 hour(s))  Valproic acid level     Status: Abnormal   Collection Time: 11/18/14  7:35 PM  Result Value Ref Range   Valproic Acid Lvl <10.0 (L) 50.0 - 100.0 ug/mL    Comment: Performed at [facility]    Physical Findings: AIMS: Facial and Oral Movements Muscles of Facial Expression: None, normal Lips and Perioral Area: None, normal Jaw: None, normal Tongue: None, normal,Extremity Movements Upper (arms, wrists, hands, fingers): None, normal Lower (legs, knees, ankles, toes): None, normal, Trunk Movements Neck, shoulders, hips: None, normal, Overall Severity Severity of abnormal movements (highest score from questions above): None, normal Incapacitation due to abnormal movements: None, normal Patient's awareness of abnormal movements (rate only patient's report): No Awareness, Dental Status Current problems with teeth and/or dentures?: No Does patient usually wear dentures?: No   CIWA:    COWS:      See Psychiatric Specialty Exam and Suicide Risk Assessment completed by Attending Physician prior to discharge.  Discharge destination:  Other:  [facility]  Is patient on multiple antipsychotic therapies at discharge:  No   Has Patient had three or more failed trials of antipsychotic monotherapy by history:  No  Recommended Plan for Multiple Antipsychotic Therapies: NA     Medication List    TAKE these medications      Indication   cyclobenzaprine 10 MG tablet  Commonly known as:  FLEXERIL  Take 10 mg by mouth 3 (three) times daily as needed for muscle spasms.      divalproex 500 MG 24 hr tablet  Commonly known as:  DEPAKOTE ER  Take 1 tablet (500 mg total) by mouth at bedtime.   Indication:  Bipolar Mood Disorder     OLANZapine 5 MG tablet  Commonly known as:  ZYPREXA  Take 1 tablet (5 mg total) by mouth 3 (three) times daily.  Indication:  Manic-Depression         Follow-up recommendations:  Activity:  As tolerated Diet:  Regular Tests:  labs: Valproic acid level, CMP, CBC, Salicylate, Acetaminophen, UDS Other:  Keep any follow up appointments  Comments:   1.Take all your medications as prescribed.  2. Report any adverse side effects to your medication to your outpatient provider. 3. Do not use alcohol or illegal drugs while taking prescription medications. 4. In the event of worsening symptoms, call 911, the crisis hotline or go to nearest emergency room for evaluation of symptoms.  Total Discharge Time: greater than 30 minutes  Signed: Alberteen Sam, FNP-BC [facility] 11/19/2014, 4:37 PM

## 2014-11-19 NOTE — BHH Counselor (Signed)
Counselor spoke with pt at bedside in OBS unit to address his discharge plans. Pt denied having any suicidal ideations, but stated that he is very paranoid and he feels that he will hurt someone if he feels that they are staring at him. Pt reports that he would like to be placed in a treatment facility where he can talk to a psychiatrist and he mentioned [facility]. Pt then stated that he [facility] can clear his thoughts. Counselor informed pt that he will be able to speak with the attending physician in regards to him seeking inpatient treatment. Pt states that he is not interested in being discharged at this time due to him being paranoid. Pt continues to report that he does not want to stay at a local shelter and that [place] Counselor will continue to provide support and encouragement while pt is in OBS unit. Counselor will pass this information along to oncoming Counselor in regards to developing a disposition for this patient.   Zenaida DeedLatoya McNeil,MA OBS Counselor

## 2014-11-19 NOTE — Discharge Instructions (Signed)
For your mental health and shelter needs, it is [facility] that [facility] for [facility] management assistance. Please refer to the informational handout included in your discharge paperwork.   It is also recommended that you continue with your prescribed medications to promote optimum mental health.

## 2014-11-19 NOTE — Progress Notes (Signed)
[facility] Assessment Progress Note  Counselor spoke with pt this morning to discuss discharge planning. Pt indicated that he is feeling "alright 'cause I'm here" today, but when he's on the "outside", he's paranoid. He described his symptoms of paranoia as feeling that "people are following me, people are watching me, people want to hurt me". Counselor inquired pt as to what the difference was when he was outside and in a facility. Pt had no explanation as to the difference, but just maintained that he felt better in a facility. Pt denied SI, HI, AVH, but did endorse HI if he felt his life was being threatened.  Counselor discussed pt's most recent treatment at [facility]. Pt shared that he was [facility] at the beginning of the [facility]. He indicated that he was hearing voices at the time and was sleeping very little. He shared that he [facility] for [facility] weeks and was not hearing the voices anymore. He was released on Zyprexa and Depakote, but indicated that he stopped taking the medicine because he wasn't hearing the voices anymore. He was also given a referral to [facility], but indicated he didn't have any transportation to get there. Two days after leaving [facility], [facility] due to having symptoms of paranoia.   Counselor discussed substance abuse concerns with pt. He indicated that he only used THC and alcohol and both were on a social, recreational level and not an addictive level.   Counselor discussed housing after discharge if not sent to a facility. Pt indicated that he didn't have anywhere to go b/c he didn't "feel safe" [place] with his ex-girlfriend. Pt stated that he gets disability on the 3rd of the month and does not have any money until then and doesn't have anyplace to stay. He asked about a halfway house or emergency disability housing until he received his disability money.  [place] moving here 5 years ago.   Marcelle SmilingSamantha Nicollette Wilhelmi, MS Counselor

## 2014-11-19 NOTE — Progress Notes (Signed)
Pt alert and cooperative. Affect/ mood depressed. Denies SI/HI, verbally contracts for safety. -A/vhall.  Emotional support and encouragement given. Will continue to monitor closely and evaluate for stabilization.

## 2014-12-26 ENCOUNTER — Encounter: Payer: Self-pay | Admitting: Emergency Medicine

## 2014-12-26 ENCOUNTER — Emergency Department
Admission: EM | Admit: 2014-12-26 | Discharge: 2014-12-26 | Disposition: A | Discharge status: Home / Self Care | Payer: Medicare Other | Attending: Student | Admitting: Student

## 2014-12-26 DIAGNOSIS — Z79899 Other long term (current) drug therapy: Secondary | ICD-10-CM | POA: Diagnosis not present

## 2014-12-26 DIAGNOSIS — I10 Essential (primary) hypertension: Secondary | ICD-10-CM | POA: Insufficient documentation

## 2014-12-26 DIAGNOSIS — Z720 Tobacco use: Secondary | ICD-10-CM | POA: Insufficient documentation

## 2014-12-26 DIAGNOSIS — Z760 Encounter for issue of repeat prescription: Secondary | ICD-10-CM | POA: Diagnosis present

## 2014-12-26 DIAGNOSIS — F319 Bipolar disorder, unspecified: Secondary | ICD-10-CM

## 2014-12-26 MED ORDER — PALIPERIDONE PALMITATE 156 MG/ML IM SUSP
156.0000 mg | Freq: Once | INTRAMUSCULAR | Status: AC
  Administered 2014-12-26: 156 mg via INTRAMUSCULAR
  Filled 2014-12-26: qty 1

## 2014-12-26 NOTE — Discharge Instructions (Signed)
Follow-up with our [facility] on June 7 as previously scheduled. Return to the emergency department for any new concerns.  Medication Refill, Emergency Department We have refilled your medication today as a courtesy to you. It is best for your medical care, however, to take care of getting refills done through your primary caregiver's office. They have your records and can do a better job of follow-up than we can in the emergency department. On maintenance medications, we often only prescribe enough medications to get you by until you are able to see your regular caregiver. This is a more expensive way to refill medications. In the future, please plan for refills so that you will not have to use the emergency department for this. Thank you for your help. Your help allows us to better take care of the daily emergencies that enter our department. Document Released: 11/03/2003 Document Revised: 10/09/2011 Document Reviewed: 10/24/2013 [facility] Patient Information 2015 [employer] [place]. This information is not intended to replace advice given to you by your health care provider. Make sure you discuss any questions you have with your health care provider.

## 2014-12-26 NOTE — ED Provider Notes (Signed)
[facility] Emergency Department Provider Note  ____________________________________________  Time seen: Approximately 1:57 PM  I have reviewed the triage vital signs and the nursing notes.   HISTORY  Chief Complaint Medication Refill    HPI Steven Vasquez is a 34 y.o. male with history of HTN, bipolar disorder, paranoid type who presents for monthly invega injection. He states he got his last injection on 11/26/2014. He reports that he is scheduled to follow up with [facility] on January 05, 2015 but has not yet established care with them. He has otherwise been feeling well. He denies any suicidal ideation, homicidal ideation, or audiovisual hallucinations. He reports he is otherwise been in his usual state of health. He has no acute medical complaints.   Past Medical History  Diagnosis Date  . Bipolar 1 disorder   . Hx of blood clots   . Hypertension   . Bipolar 1 disorder     Patient Active Problem List   Diagnosis Date Noted  . Substance induced mood disorder 11/18/2014    Past Surgical History  Procedure Laterality Date  . Fasciotomy      Current Outpatient Rx  Name  Route  Sig  Dispense  Refill  . cyclobenzaprine (FLEXERIL) 10 MG tablet   Oral   Take 10 mg by mouth 3 (three) times daily as needed for muscle spasms.         . divalproex (DEPAKOTE ER) 500 MG 24 hr tablet   Oral   Take 1 tablet (500 mg total) by mouth at bedtime.   30 tablet   0   . OLANZapine (ZYPREXA) 5 MG tablet   Oral   Take 1 tablet (5 mg total) by mouth 3 (three) times daily.   90 tablet   0     Allergies Review of patient's allergies indicates no known allergies.  No family history on file.  Social History History  Substance Use Topics  . Smoking status: Light Tobacco Smoker -- 0.10 packs/day  . Smokeless tobacco: Not on file  . Alcohol Use: 0.6 oz/week    1 Standard drinks or equivalent per week    Review of Systems Constitutional: No fever/chills Eyes:  No visual changes. ENT: No sore throat. Cardiovascular: Denies chest pain. Respiratory: Denies shortness of breath. Gastrointestinal: No abdominal pain.  No nausea, no vomiting.  No diarrhea.  No constipation. Genitourinary: Negative for dysuria. Musculoskeletal: Negative for back pain. Skin: Negative for rash. Neurological: Negative for headaches, focal weakness or numbness. Psychiatric:No SI, HI, or AVH  10-point ROS otherwise negative.  ____________________________________________   PHYSICAL EXAM:  VITAL SIGNS: ED Triage Vitals  Enc Vitals Group     BP 12/26/14 1221 105/74 mmHg     Pulse Rate 12/26/14 1221 80     Resp 12/26/14 1221 20     Temp 12/26/14 1221 98.6 F (37 C)     Temp Source 12/26/14 1221 Oral     SpO2 12/26/14 1221 97 %     Weight 12/26/14 1221 305 lb (138.347 kg)     Height 12/26/14 1221 6\' 6"  (1.981 m)     Head Cir --      Peak Flow --      Pain Score 12/26/14 1223 7     Pain Loc --      Pain Edu? --      Excl. in GC? --     Constitutional: Alert and oriented. Well appearing and in no acute distress. Eyes: Conjunctivae are normal. PERRL.  EOMI. Head: Atraumatic. Nose: No congestion/rhinnorhea. Mouth/Throat: Mucous membranes are moist.  Oropharynx non-erythematous. Neck: No stridor.  Cardiovascular: Normal rate, regular rhythm. Grossly normal heart sounds.  Good peripheral circulation. Respiratory: Normal respiratory effort.  No retractions. Lungs CTAB. Gastrointestinal: Soft and nontender. No distention. No abdominal bruits. No CVA tenderness. Genitourinary: deferred Musculoskeletal: No lower extremity tenderness nor edema.  No joint effusions. Neurologic:  Normal speech and language. No gross focal neurologic deficits are appreciated. Speech is normal. No gait instability. Skin:  Skin is warm, dry and intact. No rash noted. Psychiatric: Mood and affect are normal. Speech and behavior are normal.  ____________________________________________    LABS (all labs ordered are listed, but only abnormal results are displayed)  Labs Reviewed - No data to display ____________________________________________  EKG  none ____________________________________________  RADIOLOGY  none ____________________________________________   PROCEDURES  Procedure(s) performed: None  Critical Care performed: No  ____________________________________________   INITIAL IMPRESSION / ASSESSMENT AND PLAN / ED COURSE  Pertinent labs & imaging results that were available during my care of the patient were reviewed by me and considered in my medical decision making (see chart for details).  Steven Vasquez is a 34 y.o. male with history of HTN, bipolar disorder, paranoid type who presents for monthly invega injection. On exam, he is very well-appearing and in no acute distress. Vital signs stable, he is afebrile. He has no acute medical complaints. We will give him his monthly invega injection (d/w Dr. Coralie Keens who recommends giving 156 mg IM injection) and discharge home. I have encouraged him to continue to follow up with [facility]. ____________________________________________   FINAL CLINICAL IMPRESSION(S) / ED DIAGNOSES  Final diagnoses:  Medication refill  Bipolar affective disorder, most recent episode unspecified type, remission status unspecified      Gayla Doss, MD 12/26/14 1511

## 2014-12-26 NOTE — ED Notes (Signed)
States was diagnosed with bipolar and schizophrenia at [facility], states started injections but is not set up with someone to continue, wishes to receive injection, denies symptoms of increased trouble at this time

## 2015-01-02 ENCOUNTER — Encounter: Payer: Self-pay | Admitting: Emergency Medicine

## 2015-01-02 ENCOUNTER — Other Ambulatory Visit: Payer: Self-pay

## 2015-01-02 ENCOUNTER — Emergency Department: Payer: Medicare Other

## 2015-01-02 DIAGNOSIS — M549 Dorsalgia, unspecified: Secondary | ICD-10-CM | POA: Insufficient documentation

## 2015-01-02 DIAGNOSIS — Z79899 Other long term (current) drug therapy: Secondary | ICD-10-CM | POA: Insufficient documentation

## 2015-01-02 DIAGNOSIS — R2242 Localized swelling, mass and lump, left lower limb: Secondary | ICD-10-CM | POA: Insufficient documentation

## 2015-01-02 DIAGNOSIS — Z720 Tobacco use: Secondary | ICD-10-CM | POA: Diagnosis not present

## 2015-01-02 DIAGNOSIS — R0602 Shortness of breath: Secondary | ICD-10-CM | POA: Diagnosis not present

## 2015-01-02 DIAGNOSIS — I2699 Other pulmonary embolism without acute cor pulmonale: Secondary | ICD-10-CM | POA: Insufficient documentation

## 2015-01-02 DIAGNOSIS — I10 Essential (primary) hypertension: Secondary | ICD-10-CM | POA: Insufficient documentation

## 2015-01-02 DIAGNOSIS — R0789 Other chest pain: Secondary | ICD-10-CM | POA: Diagnosis present

## 2015-01-02 LAB — CBC
HCT: 42.6 % (ref 40.0–52.0)
Hemoglobin: 14.2 g/dL (ref 13.0–18.0)
MCH: 30.4 pg (ref 26.0–34.0)
MCHC: 33.3 g/dL (ref 32.0–36.0)
MCV: 91.5 fL (ref 80.0–100.0)
Platelets: 130 10*3/uL — ABNORMAL LOW (ref 150–440)
RBC: 4.66 MIL/uL (ref 4.40–5.90)
RDW: 13.5 % (ref 11.5–14.5)
WBC: 9.6 10*3/uL (ref 3.8–10.6)

## 2015-01-02 NOTE — ED Notes (Signed)
Pt says around 7pm tonight he began having pain to his outer right ribcage area; worse with certain movements; shortness of breath-the pain increases with deep inspiration; pt denies injury;

## 2015-01-03 ENCOUNTER — Emergency Department: Payer: Medicare Other

## 2015-01-03 ENCOUNTER — Emergency Department
Admission: EM | Admit: 2015-01-03 | Discharge: 2015-01-03 | Disposition: A | Discharge status: Home / Self Care | Payer: Medicare Other | Attending: Emergency Medicine | Admitting: Emergency Medicine

## 2015-01-03 ENCOUNTER — Emergency Department: Payer: Medicare Other | Attending: Emergency Medicine

## 2015-01-03 DIAGNOSIS — R079 Chest pain, unspecified: Secondary | ICD-10-CM

## 2015-01-03 DIAGNOSIS — I2699 Other pulmonary embolism without acute cor pulmonale: Secondary | ICD-10-CM

## 2015-01-03 LAB — COMPREHENSIVE METABOLIC PANEL
ALBUMIN: 4 g/dL (ref 3.5–5.0)
ALT: 20 U/L (ref 17–63)
ANION GAP: 6 (ref 5–15)
AST: 24 U/L (ref 15–41)
Alkaline Phosphatase: 31 U/L — ABNORMAL LOW (ref 38–126)
BILIRUBIN TOTAL: 0.3 mg/dL (ref 0.3–1.2)
BUN: 16 mg/dL (ref 6–20)
CO2: 31 mmol/L (ref 22–32)
CREATININE: 1.27 mg/dL — AB (ref 0.61–1.24)
Calcium: 8.7 mg/dL — ABNORMAL LOW (ref 8.9–10.3)
Chloride: 105 mmol/L (ref 101–111)
GFR calc Af Amer: >60 mL/min (ref >60)
GFR calc non Af Amer: >60 mL/min (ref >60)
GLUCOSE: 104 mg/dL — AB (ref 65–99)
POTASSIUM: 3.7 mmol/L (ref 3.5–5.1)
SODIUM: 142 mmol/L (ref 135–145)
TOTAL PROTEIN: 7 g/dL (ref 6.5–8.1)

## 2015-01-03 LAB — URINALYSIS COMPLETE WITH MICROSCOPIC (ARMC ONLY)
BACTERIA UA: NONE SEEN
Bilirubin Urine: NEGATIVE
Glucose, UA: NEGATIVE mg/dL
HGB URINE DIPSTICK: NEGATIVE
Ketones, ur: NEGATIVE mg/dL
Nitrite: NEGATIVE
Protein, ur: NEGATIVE mg/dL
SPECIFIC GRAVITY, URINE: 1.029 (ref 1.005–1.030)
pH: 5 (ref 5.0–8.0)

## 2015-01-03 LAB — FIBRIN DERIVATIVES D-DIMER (ARMC ONLY): FIBRIN DERIVATIVES D-DIMER (ARMC): 1396 — AB (ref 0–499)

## 2015-01-03 MED ORDER — OXYCODONE-ACETAMINOPHEN 5-325 MG PO TABS: 1.0000 | ORAL_TABLET | Freq: Four times a day (QID) | ORAL | Status: AC | PRN

## 2015-01-03 MED ORDER — ONDANSETRON HCL 4 MG PO TABS: 4.0000 mg | ORAL_TABLET | Freq: Every day | ORAL | Status: DC | PRN

## 2015-01-03 MED ORDER — OXYCODONE-ACETAMINOPHEN 5-325 MG PO TABS
ORAL_TABLET | ORAL | Status: AC
  Administered 2015-01-03: 2 via ORAL
  Filled 2015-01-03: qty 2

## 2015-01-03 MED ORDER — OXYCODONE-ACETAMINOPHEN 5-325 MG PO TABS
2.0000 | ORAL_TABLET | Freq: Once | ORAL | Status: AC
Start: 2015-01-03 — End: 2015-01-03
  Administered 2015-01-03: 2 via ORAL

## 2015-01-03 MED ORDER — SODIUM CHLORIDE 0.9 % IV BOLUS (SEPSIS)
1000.0000 mL | Freq: Once | INTRAVENOUS | Status: AC
  Administered 2015-01-03: 1000 mL via INTRAVENOUS

## 2015-01-03 MED ORDER — TAMSULOSIN HCL 0.4 MG PO CAPS: 0.4000 mg | ORAL_CAPSULE | Freq: Every day | ORAL | Status: DC

## 2015-01-03 MED ORDER — RIVAROXABAN 15 MG PO TABS
15.0000 mg | ORAL_TABLET | Freq: Once | ORAL | Status: AC
  Administered 2015-01-03: 15 mg via ORAL
  Filled 2015-01-03: qty 1

## 2015-01-03 MED ORDER — IOHEXOL 350 MG/ML SOLN
100.0000 mL | Freq: Once | INTRAVENOUS | Status: AC | PRN
  Administered 2015-01-03: 100 mL via INTRAVENOUS

## 2015-01-03 MED ORDER — OXYCODONE-ACETAMINOPHEN 5-325 MG PO TABS
ORAL_TABLET | ORAL | Status: AC
  Administered 2015-01-03: 1 via ORAL
  Filled 2015-01-03: qty 1

## 2015-01-03 MED ORDER — OXYCODONE-ACETAMINOPHEN 5-325 MG PO TABS
1.0000 | ORAL_TABLET | Freq: Once | ORAL | Status: AC
  Administered 2015-01-03: 1 via ORAL

## 2015-01-03 MED ORDER — RIVAROXABAN (XARELTO) VTE STARTER PACK (15 & 20 MG): ORAL_TABLET | ORAL | Status: DC

## 2015-01-03 NOTE — Discharge Instructions (Signed)
Chest Pain (Nonspecific) It is often hard to give a specific diagnosis for the cause of chest pain. There is always a chance that your pain could be related to something serious, such as a heart attack or a blood clot in the lungs. You need to follow up with your health care provider for further evaluation. CAUSES   Heartburn.  Pneumonia or bronchitis.  Anxiety or stress.  Inflammation around your heart (pericarditis) or lung (pleuritis or pleurisy).  A blood clot in the lung.  A collapsed lung (pneumothorax). It can develop suddenly on its own (spontaneous pneumothorax) or from trauma to the chest.  Shingles infection (herpes zoster virus). The chest wall is composed of bones, muscles, and cartilage. Any of these can be the source of the pain.  The bones can be bruised by injury.  The muscles or cartilage can be strained by coughing or overwork.  The cartilage can be affected by inflammation and become sore (costochondritis). DIAGNOSIS  Lab tests or other studies may be needed to find the cause of your pain. Your health care provider may have you take a test called an ambulatory electrocardiogram (ECG). An ECG records your heartbeat patterns over a 24-hour period. You may also have other tests, such as:  Transthoracic echocardiogram (TTE). During echocardiography, sound waves are used to evaluate how blood flows through your heart.  Transesophageal echocardiogram (TEE).  Cardiac monitoring. This allows your health care provider to monitor your heart rate and rhythm in real time.  Holter monitor. This is a portable device that records your heartbeat and can help diagnose heart arrhythmias. It allows your health care provider to track your heart activity for several days, if needed.  Stress tests by exercise or by giving medicine that makes the heart beat faster. TREATMENT   Treatment depends on what may be causing your chest pain. Treatment may include:  Acid blockers for  heartburn.  Anti-inflammatory medicine.  Pain medicine for inflammatory conditions.  Antibiotics if an infection is present.  You may be advised to change lifestyle habits. This includes stopping smoking and avoiding alcohol, caffeine, and chocolate.  You may be advised to keep your head raised (elevated) when sleeping. This reduces the chance of acid going backward from your stomach into your esophagus. Most of the time, nonspecific chest pain will improve within 2-3 days with rest and mild pain medicine.  HOME CARE INSTRUCTIONS   If antibiotics were prescribed, take them as directed. Finish them even if you start to feel better.  For the next few days, avoid physical activities that bring on chest pain. Continue physical activities as directed.  Do not use any tobacco products, including cigarettes, chewing tobacco, or electronic cigarettes.  Avoid drinking alcohol.  Only take medicine as directed by your health care provider.  Follow your health care provider's suggestions for further testing if your chest pain does not go away.  Keep any follow-up appointments you made. If you do not go to an appointment, you could develop lasting (chronic) problems with pain. If there is any problem keeping an appointment, call to reschedule. SEEK MEDICAL CARE IF:   Your chest pain does not go away, even after treatment.  You have a rash with blisters on your chest.  You have a fever. SEEK IMMEDIATE MEDICAL CARE IF:   You have increased chest pain or pain that spreads to your arm, neck, jaw, back, or abdomen.  You have shortness of breath.  You have an increasing cough, or you cough   up blood.  You have severe back or abdominal pain.  You feel nauseous or vomit.  You have severe weakness.  You faint.  You have chills. This is an emergency. Do not wait to see if the pain will go away. Get medical help at once. Call your local emergency services (911 in [place] Do not drive  yourself to the hospital. MAKE SURE YOU:   Understand these instructions.  Will watch your condition.  Will get help right away if you are not doing well or get worse. Document Released: 04/26/2005 Document Revised: 07/22/2013 Document Reviewed: 02/20/2008 Exit[employer] Patient Information 2015 [employer]. This information is not intended to replace advice given to you by your health care provider. Make sure you discuss any questions you have with your health care provider.   Pulmonary Embolism A pulmonary (lung) embolism (PE) is a blood clot that has traveled to the lung and results in a blockage of blood flow in the affected lung. Most clots come from deep veins in the legs or pelvis. PE is a dangerous and potentially life-threatening condition that can be treated if identified. CAUSES Blood clots form in a vein for different reasons. Usually several things cause blood clots. They include:  The flow of blood slows down.  The inside of the vein is damaged in some way.  The person has a condition that makes the blood clot more easily. RISK FACTORS Some people are more likely than others to develop PE. Risk factors include:   Smoking.  Being overweight (obese).  Sitting or lying still for a long time. This includes long-distance travel, paralysis, or recovery from an illness or surgery. Other factors that increase risk are:   Older age, especially over 75 years of age.  Having a family history of blood clots or if you have already had a blood clot.  Having major or lengthy surgery. This is especially true for surgery on the hip, knee, or belly (abdomen). Hip surgery is particularly high risk.  Having a long, thin tube (catheter) placed inside a vein during a medical procedure.  Breaking a hip or leg.  Having cancer or cancer treatment.  Medicines containing the male hormone estrogen. This includes birth control pills and hormone replacement therapy.  Other circulation  or heart problems.  Pregnancy and childbirth.  Hormone changes make the blood clot more easily during pregnancy.  The fetus puts pressure on the veins of the pelvis.  There is a risk of injury to veins during delivery or a caesarean delivery. The risk is highest just after childbirth.  PREVENTION   Exercise the legs regularly. Take a brisk 30 minute walk every day.  Maintain a weight that is appropriate for your height.  Avoid sitting or lying in bed for long periods of time without moving your legs.  Women, particularly those over the age of 35 years, should consider the risks and benefits of taking estrogen medicines, including birth control pills.  Do not smoke, especially if you take estrogen medicines.  Long-distance travel can increase your risk. You should exercise your legs by walking or pumping the muscles every hour.  Many of the risk factors above relate to situations that exist with hospitalization, either for illness, injury, or elective surgery. Prevention may include medical and nonmedical measures.   Your health care provider will assess you for the need for venous thromboembolism prevention when you are admitted to the hospital. If you are having surgery, your surgeon will assess you the day of   or day after surgery.  SYMPTOMS  The symptoms of a PE usually start suddenly and include:  Shortness of breath.  Coughing.  Coughing up blood or blood-tinged mucus.  Chest pain. Pain is often worse with deep breaths.  Rapid heartbeat. DIAGNOSIS  If a PE is suspected, your health care provider will take a medical history and perform a physical exam. Other tests that may be required include:  Blood tests, such as studies of the clotting properties of your blood.  Imaging tests, such as ultrasound, CT, MRI, and other tests to see if you have clots in your legs or lungs.  An electrocardiogram. This can look for heart strain from blood clots in the lungs. TREATMENT    The most common treatment for a PE is blood thinning (anticoagulant) medicine, which reduces the blood's tendency to clot. Anticoagulants can stop new blood clots from forming and old clots from growing. They cannot dissolve existing clots. Your body does this by itself over time. Anticoagulants can be given by mouth, through an intravenous (IV) tube, or by injection. Your health care provider will determine the best program for you.  Less commonly, clot-dissolving medicines (thrombolytics) are used to dissolve a PE. They carry a high risk of bleeding, so they are used mainly in severe cases.  Very rarely, a blood clot in the leg needs to be removed surgically.  If you are unable to take anticoagulants, your health care provider may arrange for you to have a filter placed in a main vein in your abdomen. This filter prevents clots from traveling to your lungs. HOME CARE INSTRUCTIONS   Take all medicines as directed by your health care provider.  Learn as much as you can about DVT.  Wear a medical alert bracelet or carry a medical alert card.  Ask your health care provider how soon you can go back to normal activities. It is important to stay active to prevent blood clots. If you are on anticoagulant medicine, avoid contact sports.  It is very important to exercise. This is especially important while traveling, sitting, or standing for long periods of time. Exercise your legs by walking or by tightening and relaxing your leg muscles regularly. Take frequent walks.  You may need to wear compression stockings. These are tight elastic stockings that apply pressure to the lower legs. This pressure can help keep the blood in the legs from clotting. Taking Warfarin Warfarin is a daily medicine that is taken by mouth. Your health care provider will advise you on the length of treatment (usually 3-6 months, sometimes lifelong). If you take warfarin:  Understand how to take warfarin and foods that  can affect how warfarin works in your body.  Too much and too little warfarin are both dangerous. Too much warfarin increases the risk of bleeding. Too little warfarin continues to allow the risk for blood clots. Warfarin and Regular Blood Testing While taking warfarin, you will need to have regular blood tests to measure your blood clotting time. These blood tests usually include both the prothrombin time (PT) and international normalized ratio (INR) tests. The PT and INR results allow your health care provider to adjust your dose of warfarin. It is very important that you have your PT and INR tested as often as directed by your health care provider.  Warfarin and Your Diet Avoid major changes in your diet, or notify your health care provider before changing your diet. Arrange a visit with a registered dietitian to answer your questions.   Many foods, especially foods high in vitamin K, can interfere with warfarin and affect the PT and INR results. You should eat a consistent amount of foods high in vitamin K. Foods high in vitamin K include:   Spinach, kale, broccoli, cabbage, collard and turnip greens, Brussels sprouts, peas, cauliflower, seaweed, and parsley.  Beef and pork liver.  Green tea.  Soybean oil. Warfarin with Other Medicines Many medicines can interfere with warfarin and affect the PT and INR results. You must:  Tell your health care provider about any and all medicines, vitamins, and supplements you take, including aspirin and other over-the-counter anti-inflammatory medicines. Be especially cautious with aspirin and anti-inflammatory medicines. Ask your health care provider before taking these.  Do not take or discontinue any prescribed or over-the-counter medicine except on the advice of your health care provider or pharmacist. Warfarin Side Effects Warfarin can have side effects, such as easy bruising and difficulty stopping bleeding. Ask your health care provider or pharmacist  about other side effects of warfarin. You will need to:  Hold pressure over cuts for longer than usual.  Notify your dentist and other health care providers that you are taking warfarin before you undergo any procedures where bleeding may occur. Warfarin with Alcohol and Tobacco   Drinking alcohol frequently can increase the effect of warfarin, leading to excess bleeding. It is best to avoid alcoholic drinks or consume only very small amounts while taking warfarin. Notify your health care provider if you change your alcohol intake.  Do not use any tobacco products including cigarettes, chewing tobacco, or electronic cigarettes. If you smoke, quit. Ask your health care provider for help with quitting smoking. Alternative Medicines to Warfarin: Factor Xa Inhibitor Medicines  These blood thinning medicines are taken by mouth, usually for several weeks or longer. It is important to take the medicine every single day, at the same time each day.  There are no regular blood tests required when using these medicines.  There are fewer food and drug interactions than with warfarin.  The side effects of this class of medicine is similar to that of warfarin, including excessive bruising or bleeding. Ask your health care provider or pharmacist about other potential side effects. SEEK MEDICAL CARE IF:   You notice a rapid heartbeat.  You feel weaker or more tired than usual.  You feel faint.  You notice increased bruising.  Your symptoms are not getting better in the time expected.  You are having side effects of medicine. SEEK IMMEDIATE MEDICAL CARE IF:   You have chest pain.  You have trouble breathing.  You have new or increased swelling or pain in one leg.  You cough up blood.  You notice blood in vomit, in a bowel movement, or in urine.  You have a fever. Symptoms of PE may represent a serious problem that is an emergency. Do not wait to see if the symptoms will go away. Get  medical help right away. Call your local emergency services (911 in the [place]). Do not drive yourself to the hospital. Document Released: 07/14/2000 Document Revised: 12/01/2013 Document Reviewed: 07/28/2013 Exit[employer] Patient Information 2015 [employer]. This information is not intended to replace advice given to you by your health care provider. Make sure you discuss any questions you have with your health care provider.  

## 2015-01-03 NOTE — ED Notes (Signed)
Called pharmacy to send pt medication. 

## 2015-01-03 NOTE — ED Notes (Signed)
Patient transported to CT 

## 2015-01-03 NOTE — ED Notes (Signed)
Pt to xray at this time.

## 2015-01-03 NOTE — ED Provider Notes (Signed)
[facility] Emergency Department Provider Note  ____________________________________________  Time seen: Approximately 1:18 AM  I have reviewed the triage vital signs and the nursing notes.   HISTORY  Chief Complaint Chest Pain and Shortness of Breath    HPI Steven Vasquez is a 34 y.o. male who comes in tonight with right-sided chest/flank pain. The patient reports that he was hanging out with his girlfriend earlier today and felt that he pulled a muscle in his right side. He reports that he was riding in the car when he started having this pain. Initially it was in the right side of his flank but also moves a little bit into the middle of his chest. The patient's girlfriend place a salon posterior head as well as use some heating lotions for the pain but it did not get any better. The patient's pain worsened throughout the day. As the patient's girlfriend started massaging it the patient reported that it hurt worse and that he felt as though he couldn't breathe. She reports that he took his night medicine including some muscle relaxers but it did not help the pain. He reports the pain is currently a 7-1/2 out of 10 in intensity. The patient reports that the pain is worse when he moves and breathes. He has never had pain like this in the past. The patient also did have some mild right neck pain earlier but that is also improved. He denies any other pain or sweats nausea or vomiting. When the patient started complaining of difficulty breathing his girlfriend suggested him come into the hospital for further evaluation.   Past Medical History  Diagnosis Date  . Bipolar 1 disorder   . Hx of blood clots   . Hypertension   . Bipolar 1 disorder     Patient Active Problem List   Diagnosis Date Noted  . Substance induced mood disorder 11/18/2014    Past Surgical History  Procedure Laterality Date  . Fasciotomy    . Right leg surgery    . Removal second metatarsal  right leg      Current Outpatient Rx  Name  Route  Sig  Dispense  Refill  . tiZANidine (ZANAFLEX) 4 MG capsule   Oral   Take 8 mg by mouth 3 (three) times daily.         . trihexyphenidyl (ARTANE) 5 MG tablet   Oral   Take 5 mg by mouth 2 (two) times daily with a meal.         . cyclobenzaprine (FLEXERIL) 10 MG tablet   Oral   Take 10 mg by mouth 3 (three) times daily as needed for muscle spasms.         . divalproex (DEPAKOTE ER) 500 MG 24 hr tablet   Oral   Take 1 tablet (500 mg total) by mouth at bedtime. Patient taking differently: Take 1,000 mg by mouth at bedtime.    30 tablet   0   . OLANZapine (ZYPREXA) 5 MG tablet   Oral   Take 1 tablet (5 mg total) by mouth 3 (three) times daily.   90 tablet   0   . oxyCODONE-acetaminophen (ROXICET) 5-325 MG per tablet   Oral   Take 1 tablet by mouth every 6 (six) hours as needed.   12 tablet   0   . Rivaroxaban (XARELTO STARTER PACK) 15 & 20 MG TBPK      Take as directed on package: Start with one 15mg  tablet by  mouth twice a day with food. On Day 22, switch to one  tablet once a day with food.   51 each   0     Allergies Review of patient's allergies indicates no known allergies.  History reviewed. No pertinent family history.  Social History History  Substance Use Topics  . Smoking status: Light Tobacco Smoker -- 0.10 packs/day  . Smokeless tobacco: Never Used  . Alcohol Use: 0.6 oz/week    1 Standard drinks or equivalent per week    Review of Systems Constitutional: No fever/chills Eyes: No visual changes. ENT: No sore throat. Cardiovascular: chest pain. Respiratory: shortness of breath. Gastrointestinal: No abdominal pain.  No nausea, no vomiting.  No diarrhea.  No constipation. Genitourinary: Negative for dysuria. Musculoskeletal: back pain. Skin: Negative for rash. Neurological: Negative for headaches, focal weakness or numbness. Hematological/Lymphatic:Occasional swelling in  legs  10-point ROS otherwise negative.  ____________________________________________   PHYSICAL EXAM:  VITAL SIGNS: ED Triage Vitals  Enc Vitals Group     BP 01/02/15 2258 126/78 mmHg     Pulse Rate 01/02/15 2258 100     Resp 01/02/15 2258 18     Temp 01/02/15 2258 99 F (37.2 C)     Temp Source 01/02/15 2258 Oral     SpO2 01/02/15 2258 99 %     Weight 01/02/15 2258 310 lb (140.615 kg)     Height 01/02/15 2258  (2.007 m)     Head Cir --      Peak Flow --      Pain Score 01/02/15 2258 6     Pain Loc --      Pain Edu? --      Excl. in GC? --     Constitutional: Alert and oriented. Well appearing and in moderate distress. Eyes: Conjunctivae are normal. PERRL. EOMI. Head: Atraumatic. Nose: No congestion/rhinnorhea. Mouth/Throat: Mucous membranes are moist.  Oropharynx non-erythematous. Cardiovascular: Normal rate, regular rhythm. Grossly normal heart sounds.  Good peripheral circulation. Respiratory: Normal respiratory effort.  No retractions. Lungs CTAB. Gastrointestinal: Soft and nontender. No distention. Bowel sounds Genitourinary: Deferred Musculoskeletal: Right sided flank pain, tenderness to palpation.   Neurologic:  Normal speech and language. No gross focal neurologic deficits are appreciated.  Skin:  Skin is warm, dry and intact. No rash noted. Psychiatric: Mood and affect are normal.   ____________________________________________   LABS (all labs ordered are listed, but only abnormal results are displayed)  Labs Reviewed  CBC - Abnormal; Notable for the following:    Platelets 130 (*)    All other components within normal limits  COMPREHENSIVE METABOLIC PANEL - Abnormal; Notable for the following:    Glucose, Bld 104 (*)    Creatinine, Ser 1.27 (*)    Calcium 8.7 (*)    Alkaline Phosphatase 31 (*)    All other components within normal limits  FIBRIN DERIVATIVES D-DIMER ([facility] ONLY) - Abnormal; Notable for the following:    Fibrin derivatives  D-dimer ([facility]) 1396 (*)    All other components within normal limits  URINALYSIS COMPLETEWITH MICROSCOPIC ([facility] ONLY) - Abnormal; Notable for the following:    Color, Urine YELLOW (*)    APPearance HAZY (*)    Leukocytes, UA 3+ (*)    Squamous Epithelial / LPF 0-5 (*)    All other components within normal limits   ____________________________________________  EKG  ED ECG REPORT I, Rebecka Apley, the attending physician, personally viewed and interpreted this ECG.   Date: 01/03/2015  EKG Time: 2319  Rate: 91  Rhythm: normal sinus rhythm  Axis: None  Intervals:none  ST&T Change: None  ____________________________________________  RADIOLOGY  Chest xray: No acute cardiopulmonary process seen  CT chest: Subsegmental pulmonary embolus in the right lower lobe, no heart strain. Groundglass opacities in both lung bases right greater than left likely atelectasis. Early pulmonary infarct on the right not entirely excluded. ____________________________________________   PROCEDURES  Procedure(s) performed: None  Critical Care performed: No  ____________________________________________   INITIAL IMPRESSION / ASSESSMENT AND PLAN / ED COURSE  Pertinent labs & imaging results that were available during my care of the patient were reviewed by me and considered in my medical decision making (see chart for details).  Patient is a 34 year old male who comes in with some right sided pain it is unable to determine if this flank pain or chest pain. The patient does have an elevated d-dimer as he has had some pain with inspiration and shortness of breath. The patient also does have a history of clots. We will perform a CT angiography of the patient's chest as well as a urinalysis to determine if the patient has any blood in his urine.  ----------------------------------------- 4:43 AM on 01/03/2015 -----------------------------------------  I discussed the results of the CT with  the patient. Initially prior to the results I thought the patient may have a kidney stone as he does have some 2 numerous to count blood in his urine. But once I did receive the results of the CT scan I informed the patient and his significant other of those results. The patient does not have any hypoxia or respiratory distress. He reports that after dose of Percocet his pain was improved. I will give the patient a dose of Xarelto and discharge him home with this pulmonary embolus. I will have the patient follow-up with Dr. Sherrlyn Hock as a hematologist and Dr. Ellsworth Lennox as a primary care physician. The patient will also receive some pain medication for his pain and should follow-up with urology as he again does have some blood in his urine. The patient and his significant other understand and agree with the plan as stated. Patient also has no tachycardia is well appearing with no other distress.   ____________________________________________   FINAL CLINICAL IMPRESSION(S) / ED DIAGNOSES  Final diagnoses:  Chest pain  Pulmonary embolus      Rebecka Apley, MD 01/03/15 [phone]

## 2015-01-03 NOTE — ED Notes (Signed)
Pt given Xarelto packet/coupon for medication

## 2015-01-05 ENCOUNTER — Ambulatory Visit (INDEPENDENT_AMBULATORY_CARE_PROVIDER_SITE_OTHER): Payer: Medicare Other | Admitting: Podiatry

## 2015-01-05 ENCOUNTER — Encounter: Payer: Self-pay | Admitting: Podiatry

## 2015-01-05 ENCOUNTER — Ambulatory Visit (INDEPENDENT_AMBULATORY_CARE_PROVIDER_SITE_OTHER): Payer: Medicare Other

## 2015-01-05 VITALS — BP 112/69 | HR 99 | Resp 18 | Ht 79.0 in | Wt 308.0 lb

## 2015-01-05 DIAGNOSIS — M1990 Unspecified osteoarthritis, unspecified site: Secondary | ICD-10-CM

## 2015-01-05 DIAGNOSIS — M79671 Pain in right foot: Secondary | ICD-10-CM

## 2015-01-05 DIAGNOSIS — M216X9 Other acquired deformities of unspecified foot: Secondary | ICD-10-CM

## 2015-01-05 DIAGNOSIS — Q667 Congenital pes cavus: Secondary | ICD-10-CM

## 2015-01-05 DIAGNOSIS — L84 Corns and callosities: Secondary | ICD-10-CM | POA: Diagnosis not present

## 2015-01-05 NOTE — Progress Notes (Signed)
Subjective:     Patient ID: Steven Vasquez, male   DOB: 10/08/1980, 34 y.o.   MRN: [id number]  HPI  34 year old male presents the office today for consult to help so the progression of the bone spur/arthritis in his right foot. He states that he has had multiple surgeries to his right foot after he under when fasciotomies for compartment syndrome. He also subsequently had what sounds to be osteomyelitis of the first metatarsal resulted in in surgery. He also has had an ankle joint fusion as well. He was previous he told that he had arthritis into his foot and would like treatment to helpl prevent the progression of this. He has a states that he has painful calluses to his feet particular right foot. He denies any redness or drainage around the areas. No other complaints at this time. Denies any recent injury or truama.    Review of Systems  All other systems reviewed and are negative.      Objective:   Physical Exam AAO x3, NAD DP/PT pulses palpable bilaterally, CRT less than 3 seconds Protective sensation decreased with Simms Weinstein monofilament There are numerous scars to the right lower extremity from prior surgeries which are all well healed.  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 There is a hyperkeratotic lesion on the dorsal lateral aspect of the right fifth digit with mild tenderness to palpation overlying the area. Upon debridement underlying skin is intact and there is no signs of infection. There is also hyperkeratotic lesions to the right heel as well as submetatarsal area. Upon debridement it underlying skin is intact and his no signs of infection. There does appear to be  An increase in medial arch height upon weightbearing. No are no other specific area of tenderness to bilateral lower extremities. MMT 5/5, ROM WNL.  No open lesions or other pre-ulcerative lesions.  No overlying edema, erythema,  increase in warmth to bilateral lower extremities.  No pain with calf compression, swelling, warmth, erythema bilaterally.      Assessment:      34 year old male with history of multiple surgeries the right lower extremity pre-ulcerative calluses     Plan:     -X-rays were obtained and reviewed with the patient.  -Treatment options discussed including all alternatives, risks, and complications -Hyperkerotic lesions sharply debrided without complications/bleeding -I believe that to help slow the progression of the arthritis and to help alleviate the pressure off the pre-ulcerative calluses he would benefit from CMO. A prescription for these were given to the patient to go to [facility]. He previously had them made by [facility] several years ago.  -Follow-up after the inserts are sooner if any problems are to arise. In the meantime, encouraged to call the  Office with any questions, concerns, change in symptoms.

## 2015-01-19 DIAGNOSIS — F209 Schizophrenia, unspecified: Secondary | ICD-10-CM | POA: Insufficient documentation

## 2015-01-19 DIAGNOSIS — I2699 Other pulmonary embolism without acute cor pulmonale: Secondary | ICD-10-CM | POA: Insufficient documentation

## 2015-02-16 ENCOUNTER — Encounter: Payer: Self-pay | Admitting: *Deleted

## 2015-02-16 NOTE — Progress Notes (Signed)
[facility]-informations sent back.

## 2015-03-29 ENCOUNTER — Telehealth: Payer: Self-pay | Admitting: Podiatry

## 2015-03-29 ENCOUNTER — Telehealth: Payer: Self-pay | Admitting: *Deleted

## 2015-03-29 NOTE — Telephone Encounter (Signed)
Entered in error

## 2015-03-29 NOTE — Telephone Encounter (Signed)
Pt needs Bio- Freeze for his feet ( non Diabetic ) he doesn't want anything that will make his feet burn

## 2015-03-30 MED ORDER — MENTHOL (TOPICAL ANALGESIC) 4 % EX GEL: CUTANEOUS | Status: DC

## 2015-03-30 NOTE — Telephone Encounter (Signed)
OK to do biofreeze

## 2015-03-30 NOTE — Telephone Encounter (Addendum)
Pt request BIOFREEZE.  I asked to speak with pt and pt's girlfriend states pt wants medication for the burning in the top of his feet, feels like they are cut, but she could not see any cuts.  Pt has an appt 04/22/2015.  i left message to tell pt we would call again with orders from Dr. Ardelle Anton.  Left message with Dr. Gabriel Rung order, informed pt the Biofreeze could be purchased in the [place] office for $15.00.

## 2015-03-30 NOTE — Addendum Note (Signed)
Addended by: Alphia Kava D on: 03/30/2015 04:59 PM   Modules accepted: Orders

## 2015-03-30 NOTE — Telephone Encounter (Signed)
Pt request

## 2015-04-15 ENCOUNTER — Encounter: Payer: Self-pay | Admitting: *Deleted

## 2015-04-15 ENCOUNTER — Emergency Department
Admission: EM | Admit: 2015-04-15 | Discharge: 2015-04-15 | Disposition: A | Discharge status: Home / Self Care | Payer: Medicare Other | Attending: Emergency Medicine | Admitting: Emergency Medicine

## 2015-04-15 DIAGNOSIS — Z79899 Other long term (current) drug therapy: Secondary | ICD-10-CM | POA: Diagnosis not present

## 2015-04-15 DIAGNOSIS — Y998 Other external cause status: Secondary | ICD-10-CM | POA: Insufficient documentation

## 2015-04-15 DIAGNOSIS — F419 Anxiety disorder, unspecified: Secondary | ICD-10-CM | POA: Insufficient documentation

## 2015-04-15 DIAGNOSIS — T407X4A Poisoning by cannabis (derivatives), undetermined, initial encounter: Secondary | ICD-10-CM | POA: Diagnosis not present

## 2015-04-15 DIAGNOSIS — Z720 Tobacco use: Secondary | ICD-10-CM | POA: Diagnosis not present

## 2015-04-15 DIAGNOSIS — I10 Essential (primary) hypertension: Secondary | ICD-10-CM | POA: Insufficient documentation

## 2015-04-15 DIAGNOSIS — Y9289 Other specified places as the place of occurrence of the external cause: Secondary | ICD-10-CM | POA: Diagnosis not present

## 2015-04-15 DIAGNOSIS — T50905A Adverse effect of unspecified drugs, medicaments and biological substances, initial encounter: Secondary | ICD-10-CM

## 2015-04-15 DIAGNOSIS — Y9389 Activity, other specified: Secondary | ICD-10-CM | POA: Diagnosis not present

## 2015-04-15 DIAGNOSIS — Z7901 Long term (current) use of anticoagulants: Secondary | ICD-10-CM | POA: Insufficient documentation

## 2015-04-15 MED ORDER — LORAZEPAM 1 MG PO TABS
1.0000 mg | ORAL_TABLET | Freq: Once | ORAL | Status: AC
  Administered 2015-04-15: 1 mg via ORAL

## 2015-04-15 MED ORDER — LORAZEPAM 1 MG PO TABS
ORAL_TABLET | ORAL | Status: AC
  Administered 2015-04-15: 1 mg via ORAL
  Filled 2015-04-15: qty 1

## 2015-04-15 NOTE — Discharge Instructions (Signed)
Please be careful what kind of drug use. Remember what happened this time do not use of marijuana again

## 2015-04-15 NOTE — ED Notes (Signed)
BEHAVIORAL HEALTH ROUNDING Patient sleeping: No. Patient alert and oriented: yes Behavior appropriate: Yes.  ; If no, describe:  Nutrition and fluids offered: Yes  Toileting and hygiene offered: Yes  Sitter present: yes Law enforcement present: Yes  

## 2015-04-15 NOTE — ED Provider Notes (Signed)
[facility] Emergency Department Provider Note  ____________________________________________  Time seen: Approximately 10:50 PM  I have reviewed the triage vital signs and the nursing notes.   HISTORY  Chief Complaint Anxiety    HPI Steven Vasquez is a 34 y.o. male who reports he smoked some marijuana today. This made him very anxious and paranoid and he was hearing voices for a brief period. He reports his girlfriend gave him one of her Depakote. This and time has helped him recover. He is not currently hearing any voices he is not paranoid is not anxious he feels back to normal and has been back to normal for her. Several hours now behavioral health intake is seeing him and agrees that he is not a threat to himself or anybody else at this point. I reviewed with the patient's fact that marijuana can cause these Reactions patient says he knows this and will not use it again  Past Medical History  Diagnosis Date  . Bipolar 1 disorder   . Hx of blood clots   . Hypertension   . Bipolar 1 disorder     Patient Active Problem List   Diagnosis Date Noted  . Substance induced mood disorder 11/18/2014    Past Surgical History  Procedure Laterality Date  . Fasciotomy    . Right leg surgery    . Removal second metatarsal right leg      Current Outpatient Rx  Name  Route  Sig  Dispense  Refill  . cyclobenzaprine (FLEXERIL) 10 MG tablet   Oral   Take 10 mg by mouth 3 (three) times daily as needed for muscle spasms.         . divalproex (DEPAKOTE ER) 500 MG 24 hr tablet   Oral   Take 1 tablet (500 mg total) by mouth at bedtime. Patient taking differently: Take 1,000 mg by mouth at bedtime.    30 tablet   0   . Menthol, Topical Analgesic, 4 % GEL      Apply to affected area no more than 4 times a day   1 Tube   11   . OLANZapine (ZYPREXA) 5 MG tablet   Oral   Take 1 tablet (5 mg total) by mouth 3 (three) times daily.   90 tablet   0   .  oxyCODONE-acetaminophen (ROXICET) 5-325 MG per tablet   Oral   Take 1 tablet by mouth every 6 (six) hours as needed.   12 tablet   0   . Rivaroxaban (XARELTO STARTER PACK) 15 & 20 MG TBPK      Take as directed on package: Start with one  tablet by mouth twice a day with food. On Day 22, switch to one  tablet once a day with food.   51 each   0   . tiZANidine (ZANAFLEX) 4 MG capsule   Oral   Take 8 mg by mouth 3 (three) times daily.         . trihexyphenidyl (ARTANE) 5 MG tablet   Oral   Take 5 mg by mouth 2 (two) times daily with a meal.           Allergies Review of patient's allergies indicates no known allergies.  No family history on file.  Social History Social History  Substance Use Topics  . Smoking status: Light Tobacco Smoker -- 0.10 packs/day  . Smokeless tobacco: Never Used  . Alcohol Use: 0.6 oz/week    1 Standard drinks or  equivalent per week    Review of Systems Constitutional: No fever/chills Eyes: No visual changes. ENT: No sore throat. Cardiovascular: Denies chest pain. Respiratory: Denies shortness of breath. Gastrointestinal: No abdominal pain.  No nausea, no vomiting.  No diarrhea.  No constipation. Genitourinary: Negative for dysuria. Musculoskeletal: Negative for back pain. Skin: Negative for rash.  10-point ROS otherwise negative.  ____________________________________________   PHYSICAL EXAM:  VITAL SIGNS: ED Triage Vitals  Enc Vitals Group     BP 04/15/15 1924 150/93 mmHg     Pulse Rate 04/15/15 1924 102     Resp 04/15/15 1924 18     Temp 04/15/15 1924 98.3 F (36.8 C)     Temp Source 04/15/15 1924 Oral     SpO2 04/15/15 1924 100 %     Weight 04/15/15 1924 300 lb (136.079 kg)     Height 04/15/15 1924 6\' 7"  (2.007 m)     Head Cir --      Peak Flow --      Pain Score 04/15/15 1932 5     Pain Loc --      Pain Edu? --      Excl. in GC? --     Constitutional: Alert and oriented. Well appearing and in no acute  distress. Eyes: Conjunctivae are normal. PERRL. EOMI. Head: Atraumatic. Nose: No congestion/rhinnorhea. Mouth/Throat: Mucous membranes are moist.  Oropharynx non-erythematous. Neck: No stridor. Cardiovascular: Normal rate, regular rhythm. Grossly normal heart sounds.  Good peripheral circulation. Respiratory: Normal respiratory effort.  No retractions. Lungs CTAB. Gastrointestinal: Soft and nontender. No distention. No abdominal bruits. No CVA tenderness. Musculoskeletal: No lower extremity tenderness nor edema.  No joint effusions. Neurologic:  Normal speech and language. No gross focal neurologic deficits are appreciated. No gait instability. Skin:  Skin is warm, dry and intact. No rash noted. Psychiatric: Mood and affect are normal. Speech and behavior are normal.  ____________________________________________   LABS (all labs ordered are listed, but only abnormal results are displayed)  Labs Reviewed - No data to display ____________________________________________  EKG   ____________________________________________  RADIOLOGY   ____________________________________________   PROCEDURES    ____________________________________________   INITIAL IMPRESSION / ASSESSMENT AND PLAN / ED COURSE  Pertinent labs & imaging results that were available during my care of the patient were reviewed by me and considered in my medical decision making (see chart for details).   ____________________________________________   FINAL CLINICAL IMPRESSION(S) / ED DIAGNOSES  Final diagnoses:  Adverse reaction to drug, initial encounter      Arnaldo Natal, MD 04/16/15 [phone]

## 2015-04-15 NOTE — ED Notes (Addendum)
Pt states he smoked weed today, haven't smoked weed in 6 months, and "had an episode." Pt states he feels paranoid like others are after him, "I just think I was high and became paranoid for no reason, but I feel fine now" "I just want something to calm me down". Denies SI/HI. Pt girlfriend states that she gave him depakote at 1830 to try to calm his symptoms. Hx of drug induced schizophrenia.. Pt denies he is seeking help with drug use.

## 2015-04-15 NOTE — ED Notes (Signed)
Pt presents to ER after he smoked weed today, haven't smoked weed in 6 months, and "had an episode." Pt stated he felt paranoid like others are after him, "I just think I was high and became paranoid for no reason, but I feel fine now after ativan " "I just needed something to calm me down so my girlfriend brought me up here." Denies SI/HI. Pt reports girlfriend states that she gave him depakote at 1830 to try to calm his symptoms.  Hx of drug induced schizophrenia Pt also noted to have bipolar. Pt denies he is seeking help with drug use only does it occasionally.

## 2015-04-15 NOTE — ED Notes (Signed)
Spoke to Dr. Darnelle Catalan about the pt, order for Ativan, will put pt in Subwait at this time and explained plan of care.

## 2015-04-15 NOTE — ED Notes (Signed)

## 2015-04-22 ENCOUNTER — Ambulatory Visit (INDEPENDENT_AMBULATORY_CARE_PROVIDER_SITE_OTHER): Payer: Medicare Other | Admitting: Podiatry

## 2015-04-22 ENCOUNTER — Encounter: Payer: Self-pay | Admitting: Podiatry

## 2015-04-22 DIAGNOSIS — B351 Tinea unguium: Secondary | ICD-10-CM

## 2015-04-22 DIAGNOSIS — G629 Polyneuropathy, unspecified: Secondary | ICD-10-CM

## 2015-04-22 DIAGNOSIS — L84 Corns and callosities: Secondary | ICD-10-CM

## 2015-04-23 DIAGNOSIS — M79673 Pain in unspecified foot: Secondary | ICD-10-CM

## 2015-04-23 NOTE — Progress Notes (Signed)
Patient ID: Steven Vasquez, male   DOB: [birth year], 34 y.o.   MRN: [id number]  Subjective: 34 year old male presents the office today for follow-up evaluation of thick, painful, elongated toenails which are painful and he is unable to trim himself. He also has calluses the bottom of the feet protective of the right side. He continues have pain in the arch of his feet and also describes a throbbing, burning pain to both of his feet most in the right side. He states this burning pain started after multiple surgeries to his right side. No acute changes since last appointment and no other complaints at this time.  Objective: AAO 3, NAD DP/PT pulses palpable, CRT less than 3 seconds Protective sensation decreased with Simms Weinstein monofilament, Achilles tendon reflex intact. Multiple scars present over on the right lower extremity for multiple surgeries which are well-healed. Hammertoe contractures are continued bilaterally with adductovarus of the fifth digit right greater than left. Hyperkeratotic lesions on the dorsal lateral aspect of the right fifth digit as well as the right plantar heel. Upon debridement there is no underlying ulceration, drainage or other signs of infection. Nails are hypertrophic, dystrophic, little, discolored particularly the hallux toenails. There is no swelling erythema or drainage. No tenderness to palpation around the nails this time. There is an increase in medial arch height upon weightbearing. There is no area pinpoint bony tenderness or pain the vibratory sensation bilaterally. There is no overlying edema, erythema, increased warmth. No pain with calf compression, swelling, warmth, erythema.  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 I discussed and possible treatment options including gabapentin. Given his other medications will discuss with his primary care physician before starting treatment. -Hyperkeratotic lesion sharply debrided without complication/bleeding. -Nail sharply debrided 10 without complication/bleeding. -Continue supportive shoe gear and inserts to help offload the symptomatic areas. -Follow-up in 3 months or sooner if any problems arise. In the meantime, encouraged to call the office with any questions, concerns, change in symptoms.   Ovid Curd, DPM

## 2015-04-26 ENCOUNTER — Encounter: Payer: Self-pay | Admitting: *Deleted

## 2015-04-26 ENCOUNTER — Telehealth: Payer: Self-pay | Admitting: *Deleted

## 2015-04-26 NOTE — Telephone Encounter (Signed)
Steven Vasquez states pt is at an appt, that his primary is Dr. Benita Gutter at [facility].  Letter and chart notes of last visit faxed to Dr. Benita Gutter, fax [phone].

## 2015-08-05 ENCOUNTER — Ambulatory Visit: Payer: Medicare Other

## 2015-08-05 ENCOUNTER — Ambulatory Visit: Payer: Medicare Other | Admitting: Podiatry

## 2015-08-10 ENCOUNTER — Ambulatory Visit: Payer: Medicare Other | Admitting: Podiatry

## 2015-09-14 ENCOUNTER — Encounter: Payer: Self-pay | Admitting: Podiatry

## 2015-09-14 ENCOUNTER — Ambulatory Visit (INDEPENDENT_AMBULATORY_CARE_PROVIDER_SITE_OTHER): Payer: Medicare Other | Admitting: Podiatry

## 2015-09-14 DIAGNOSIS — L84 Corns and callosities: Secondary | ICD-10-CM | POA: Diagnosis not present

## 2015-09-14 DIAGNOSIS — G629 Polyneuropathy, unspecified: Secondary | ICD-10-CM

## 2015-09-14 NOTE — Progress Notes (Signed)
Patient ID: Steven Vasquez, male   DOB: [birth year], 35 y.o.   MRN: [id number]  Subjective: 35 year old male presents the office today for follow-up evaluation of painful callues to his right foot which are getting very thick. No surrounding redness or drainage. No signs of infection. He does not wish for his toenails to be trimmed today. No acute changes since last appointment and no other complaints at this time.  Objective: AAO 3, NAD DP/PT pulses palpable, CRT less than 3 seconds Protective sensation decreased with Simms Weinstein monofilament Multiple scars present over on the right lower extremity for multiple surgeries which are well-healed. Hammertoe contractures are continued bilaterally with adductovarus of the fifth digit right greater than left. Hyperkeratotic lesions on the dorsal lateral aspect of the right fifth digit as well as the right plantar heel. Upon debridement there is no underlying ulceration, drainage or other signs of infection. Nails are hypertrophic, dystrophic, little, discolored particularly the hallux toenails. There is no surrounding erythema or drainage. No tenderness to palpation around the nails this time. There is an increase in medial arch height upon weightbearing. There is no area pinpoint bony tenderness or pain the vibratory sensation bilaterally. There is no overlying edema, erythema, increased warmth. No pain with calf compression, swelling, warmth, erythema.  Assessment: 35 year old male with likely neuropathy, pre-ulcerative calluses  Plan: -Treatment options discussed including all alternatives, risks, and complications -Hyperkeratotic lesion sharply debrided without complication/bleeding. -Continue supportive shoe gear and inserts to help offload the symptomatic areas. -Follow-up in 3 months or sooner if any problems arise. In the meantime, encouraged to call the office with any questions, concerns, change in symptoms.   Ovid Curd, DPM

## 2015-10-15 DIAGNOSIS — G4733 Obstructive sleep apnea (adult) (pediatric): Secondary | ICD-10-CM | POA: Insufficient documentation

## 2015-11-24 IMAGING — CT CT ANGIO CHEST
1 of 2 series · 18 of 30 positions shown · IV contrast (APPLIED)
Comparison: Chest radiographs earlier this day.

CLINICAL DATA: Right-sided chest pain and shortness of breath since
[REDACTED]. History of DVT.

EXAM:
CT ANGIOGRAPHY CHEST WITH CONTRAST
TECHNIQUE: Multidetector CT imaging of the chest was performed using the
standard protocol during bolus administration of intravenous
contrast. Multiplanar CT image reconstructions and MIPs were
obtained to evaluate the vascular anatomy.
CONTRAST:  100mL OMNIPAQUE IOHEXOL 350 MG/ML SOLN

[Series 5: pe 1.0 thins · axial · 0.81mm/px · z∈[-62,+185]mm · 18 of 279 slices shown]
[im 16/279  lung]
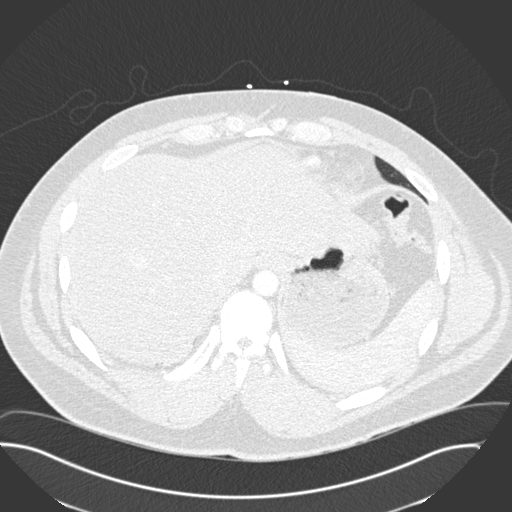
[im 31/279  mediastinal]
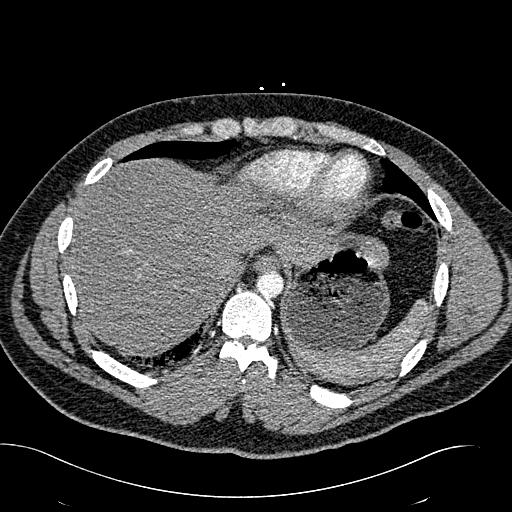
[im 47/279  lung]
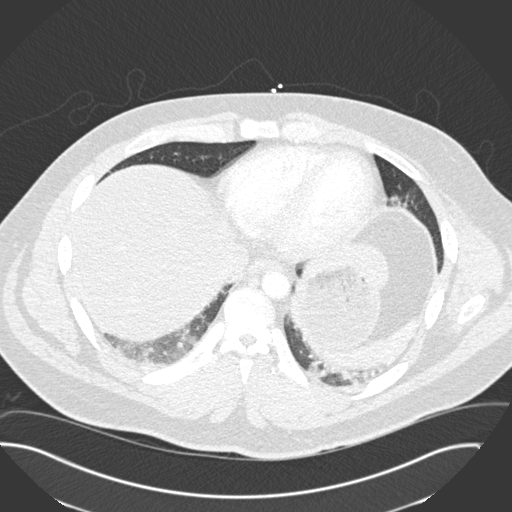
[im 62/279  mediastinal]
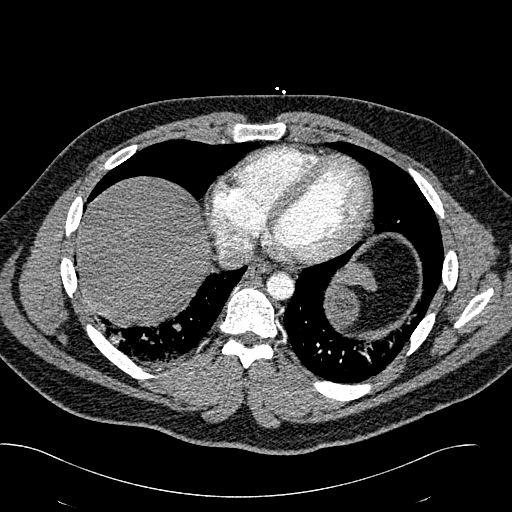
[im 78/279  lung]
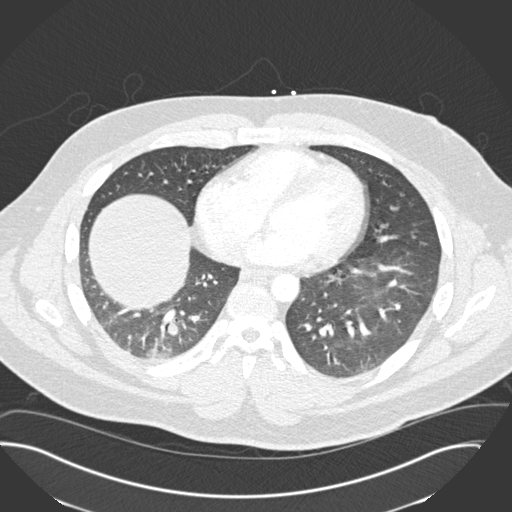
[im 93/279  mediastinal]
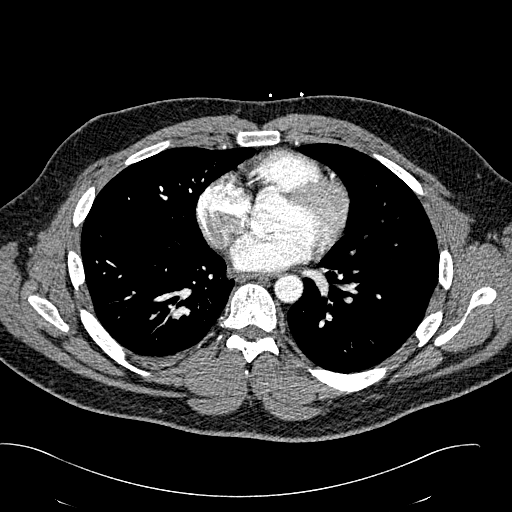
[im 109/279  lung]
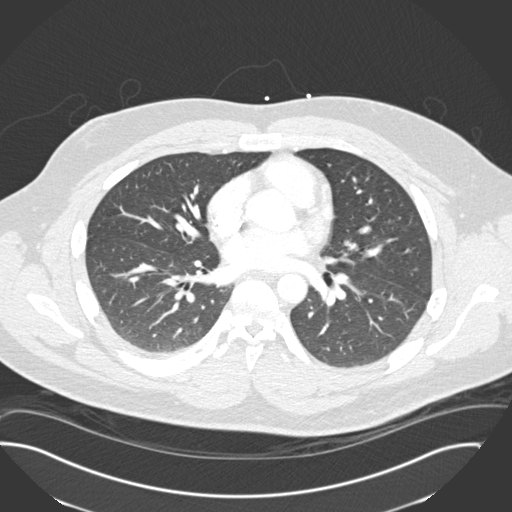
[im 124/279  mediastinal]
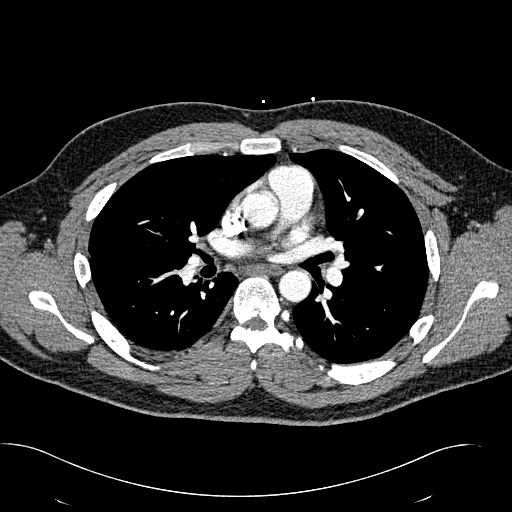
[im 130/279  lung]
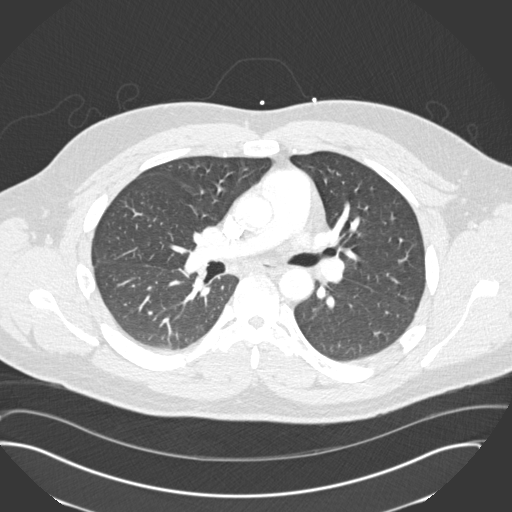
[im 140/279  mediastinal]
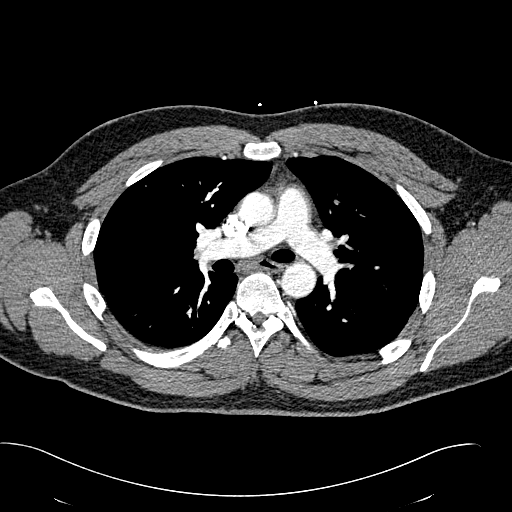
[im 155/279  lung]
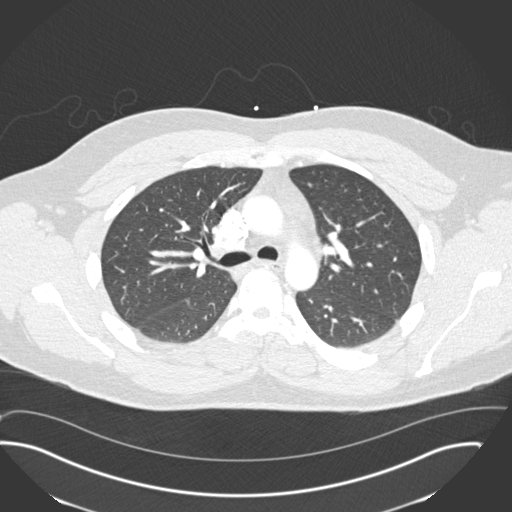
[im 170/279  mediastinal]
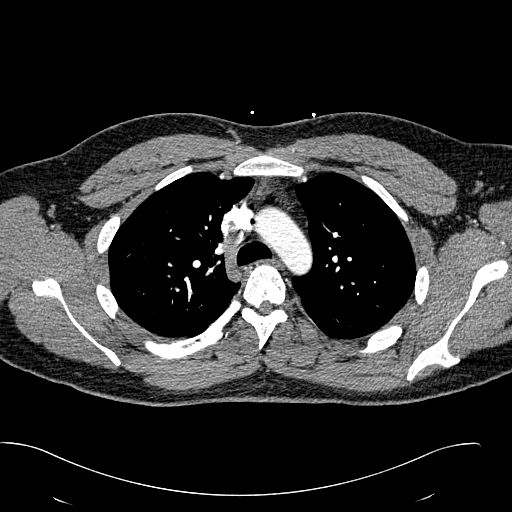
[im 186/279  lung]
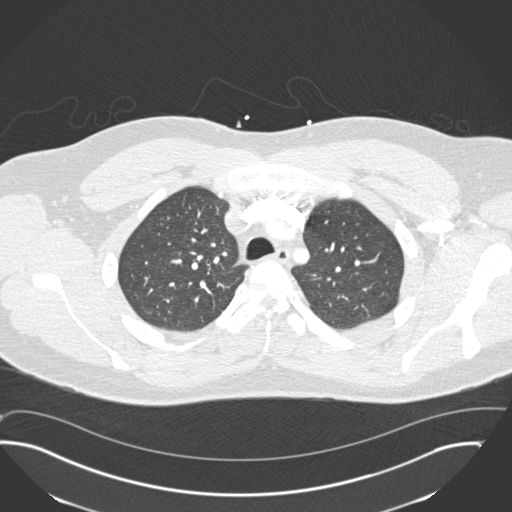
[im 201/279  mediastinal]
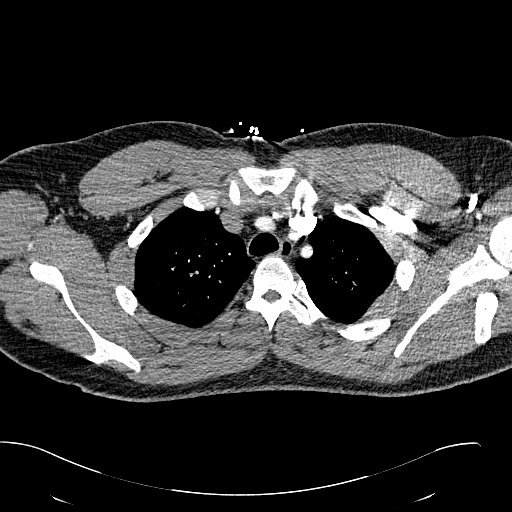
[im 217/279  lung]
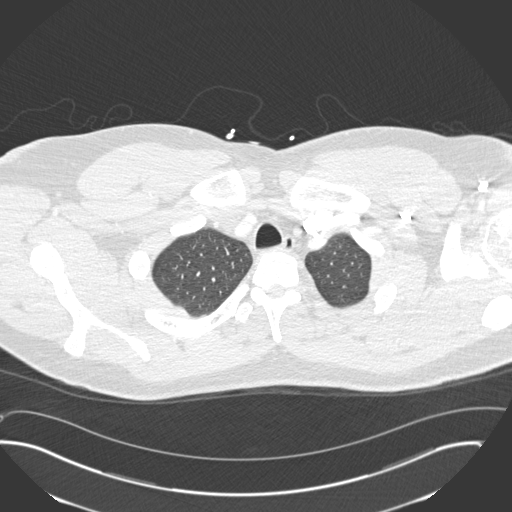
[im 232/279  mediastinal]
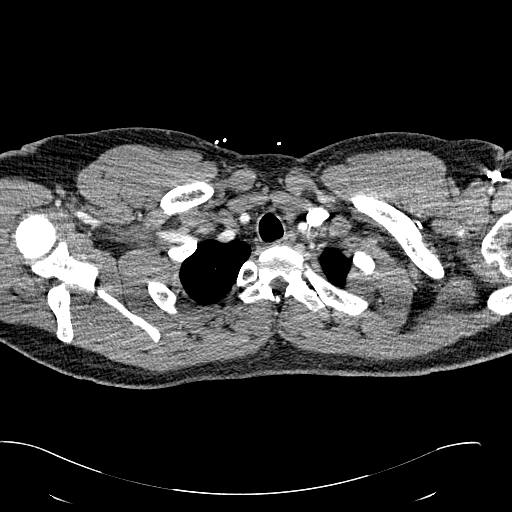
[im 248/279  lung]
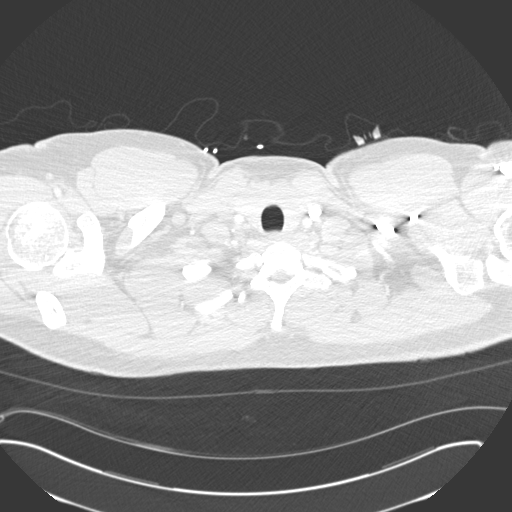
[im 263/279  mediastinal]
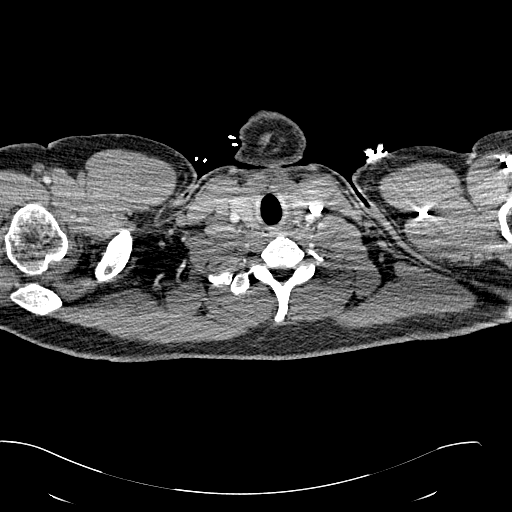

[18 of 30 positions shown; findings below may reference images not displayed]

FINDINGS: Filling defect in the right lower lobe subsegmental artery
consistent with pulmonary embolus. There is no right heart strain.

Thoracic aorta is normal in caliber. The heart size is normal. There
is no mediastinal or hilar adenopathy. Faint soft tissue density in
the anterior mediastinum is likely residual thymus. No pleural or
pericardial effusion.

Minimal peripheral density in the right lower lobe, may reflect
atelectasis, however given presence of embolus to this region, small
infarct could have a similar appearance. Minimal hypoventilatory
change at the left lung base. No pulmonary nodule or mass.

Evaluation of the upper abdomen demonstrates hepatic steatosis.

There are no acute or suspicious osseous abnormalities.

Review of the MIP images confirms the above findings.
IMPRESSION: 1. Subsegmental pulmonary embolus in the right lower lobe. No heart
strain.
2. Ground-glass opacities in both lung bases, right greater than
left. This likely represents atelectasis, though a component of
early pulmonary infarct on the right is not entirely excluded.
Critical Value/emergent results were called by telephone at the time
of interpretation on 01/03/2015 at [DATE] to Dr. CRESENCIO GIVEN ,
who verbally acknowledged these results.

## 2015-12-14 ENCOUNTER — Ambulatory Visit: Payer: Medicare Other | Admitting: Podiatry

## 2015-12-30 ENCOUNTER — Ambulatory Visit: Payer: Medicare Other | Admitting: Podiatry

## 2016-05-23 ENCOUNTER — Ambulatory Visit: Payer: Medicare Other | Admitting: Podiatry

## 2016-06-11 ENCOUNTER — Emergency Department
Admission: EM | Admit: 2016-06-11 | Discharge: 2016-06-11 | Disposition: A | Discharge status: Home / Self Care | Payer: No Typology Code available for payment source | Attending: Student in an Organized Health Care Education/Training Program | Admitting: Student in an Organized Health Care Education/Training Program

## 2016-06-11 DIAGNOSIS — Y92410 Unspecified street and highway as the place of occurrence of the external cause: Secondary | ICD-10-CM | POA: Diagnosis not present

## 2016-06-11 DIAGNOSIS — Y999 Unspecified external cause status: Secondary | ICD-10-CM | POA: Insufficient documentation

## 2016-06-11 DIAGNOSIS — Z79899 Other long term (current) drug therapy: Secondary | ICD-10-CM | POA: Diagnosis not present

## 2016-06-11 DIAGNOSIS — I10 Essential (primary) hypertension: Secondary | ICD-10-CM | POA: Insufficient documentation

## 2016-06-11 DIAGNOSIS — S161XXA Strain of muscle, fascia and tendon at neck level, initial encounter: Secondary | ICD-10-CM | POA: Diagnosis not present

## 2016-06-11 DIAGNOSIS — M7918 Myalgia, other site: Secondary | ICD-10-CM

## 2016-06-11 DIAGNOSIS — S199XXA Unspecified injury of neck, initial encounter: Secondary | ICD-10-CM | POA: Diagnosis present

## 2016-06-11 DIAGNOSIS — Y939 Activity, unspecified: Secondary | ICD-10-CM | POA: Diagnosis not present

## 2016-06-11 DIAGNOSIS — F17200 Nicotine dependence, unspecified, uncomplicated: Secondary | ICD-10-CM | POA: Insufficient documentation

## 2016-06-11 MED ORDER — IBUPROFEN 800 MG PO TABS: 800.0000 mg | ORAL_TABLET | Freq: Three times a day (TID) | ORAL | 0 refills | Status: AC | PRN

## 2016-06-11 MED ORDER — CYCLOBENZAPRINE HCL 5 MG PO TABS: 5.0000 mg | ORAL_TABLET | Freq: Three times a day (TID) | ORAL | 0 refills | Status: DC | PRN

## 2016-06-11 MED ORDER — IBUPROFEN 800 MG PO TABS
800.0000 mg | ORAL_TABLET | Freq: Once | ORAL | Status: AC
  Administered 2016-06-11: 800 mg via ORAL
  Filled 2016-06-11: qty 1

## 2016-06-11 MED ORDER — CYCLOBENZAPRINE HCL 10 MG PO TABS
10.0000 mg | ORAL_TABLET | Freq: Once | ORAL | Status: AC
  Administered 2016-06-11: 10 mg via ORAL
  Filled 2016-06-11: qty 1

## 2016-06-11 NOTE — ED Notes (Signed)
NAD noted at time of D/C. Pt denies questions or concerns. Pt ambulatory to the lobby at this time.  
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
## 2016-06-11 NOTE — Discharge Instructions (Signed)
Please take ibuprofen and Flexeril as prescribed. Return to the ER for any worsening symptoms or urgent changes in her health. Follow-up with primary care physician if no improvement in 5 days.

## 2016-07-06 ENCOUNTER — Ambulatory Visit: Payer: Medicare Other | Admitting: Podiatry

## 2017-05-14 ENCOUNTER — Encounter: Payer: Self-pay | Admitting: Podiatry

## 2017-05-14 ENCOUNTER — Ambulatory Visit (INDEPENDENT_AMBULATORY_CARE_PROVIDER_SITE_OTHER): Payer: Medicare Other | Admitting: Podiatry

## 2017-05-14 DIAGNOSIS — L84 Corns and callosities: Secondary | ICD-10-CM | POA: Diagnosis not present

## 2017-05-14 DIAGNOSIS — G629 Polyneuropathy, unspecified: Secondary | ICD-10-CM

## 2017-05-14 DIAGNOSIS — M79673 Pain in unspecified foot: Secondary | ICD-10-CM

## 2017-05-14 NOTE — Progress Notes (Signed)
This patient presents the office follow-up for an evaluation of painful calluses on his right foot. He says these calluses are painful as he walks and wears his shoes. He says he has not been seen recently and is aware he has done a large amount of walking.  This patient has a history of multiple surgeries on his right leg including fasciotomies on his right leg and foot. Right ankle fusion and removal of ischemic tissue on his right leg.  This has caused him to walk and develop multiple calluses on his right foot.  General Appearance  Alert, conversant and in no acute stress.  Vascular  Dorsalis pedis and posterior pulses are palpable  bilaterally.  Capillary return is within normal limits  Bilaterally. Temperature is within normal limits  Bilaterally  Neurologic  Senn-Weinstein monofilament wire test diminished  bilaterally. Muscle power  Within normal limits bilaterally.  Nails Thick disfigured discolored nails with subungual debride bilaterally from hallux left foot. No evidence of bacterial infection or drainage bilaterally.  Orthopedic  No limitations of motion of motion feet bilaterally.  No crepitus or effusions noted.  No bony pathology or digital deformities noted. Hammer toe contractures noted on the fifth digits bilaterally  Skin  there is a porokeratosis noted sub-2 of the right foot.  There is a painful corn noted on the distal tip of the fifth toe of the right foot.  There is callus formation on the inside of his right heel which appears to be close to being a deep fissure.  There is also callus noted on the plantar as well as the lateral aspect of the right heel. No signs of infections or ulcers noted  Callus and porokeratosis right foot.   Debridement of porokeratotic lesions, right foot.  Debridement of callus on the heels of both feet.  Discussed possible surgery for the correction of the corn on the fifth toe of the right foot.  Return to clinic in 3 months for further  evaluation and treatment   Helane Gunther DPM

## 2017-08-13 ENCOUNTER — Encounter: Payer: Self-pay | Admitting: Podiatry

## 2017-08-13 ENCOUNTER — Ambulatory Visit (INDEPENDENT_AMBULATORY_CARE_PROVIDER_SITE_OTHER): Payer: Medicare Other | Admitting: Podiatry

## 2017-08-13 DIAGNOSIS — M79673 Pain in unspecified foot: Secondary | ICD-10-CM

## 2017-08-13 DIAGNOSIS — L84 Corns and callosities: Secondary | ICD-10-CM | POA: Diagnosis not present

## 2017-08-13 NOTE — Progress Notes (Signed)
This patient presents the office follow-up for an evaluation of painful calluses on his right foot. He says these calluses are painful as he walks and wears his shoes. He says he has not been seen recently and is aware he has done a large amount of walking.  This patient has a history of multiple surgeries on his right leg including fasciotomies on his right leg and foot. Right ankle fusion and removal of ischemic tissue on his right leg.  This has caused him to walk and develop multiple calluses on his right foot. Painful callus on bottom and side of right heel.  General Appearance  Alert, conversant and in no acute stress.  Vascular  Dorsalis pedis and posterior pulses are palpable  bilaterally.  Capillary return is within normal limits  Bilaterally. Temperature is within normal limits  Bilaterally  Neurologic  Senn-Weinstein monofilament wire test diminished  bilaterally. Muscle power  Within normal limits bilaterally.  Nails Thick disfigured discolored nails with subungual debride bilaterally from hallux left foot. No evidence of bacterial infection or drainage bilaterally.  Orthopedic  No limitations of motion of motion feet bilaterally.  No crepitus or effusions noted.  No bony pathology or digital deformities noted. Hammer toe contractures noted on the fifth digits bilaterally  Skin  Porokeratosis sub 2 resolved.  There is a painful corn noted on the distal tip of the fifth toe of the right foot.  There is callus formation on the outside  of his right heel   There is also callus noted on the plantar  the right heel. No signs of infections or ulcers noted  Callus and porokeratosis right foot.   Debridement of porokeratotic lesions, right foot.  Debridement of callus on the heels of both feet.  Callus sub 2 has resolved and pain in fifth toe has resolved.    Return to clinic in 3 months for further evaluation and treatment   Helane GuntherGregory Tannah Dreyfuss DPM

## 2017-09-03 ENCOUNTER — Ambulatory Visit
Admission: RE | Admit: 2017-09-03 | Discharge: 2017-09-03 | Disposition: A | Discharge status: Home / Self Care | Payer: Disability Insurance | Source: Ambulatory Visit | Attending: *Deleted | Admitting: *Deleted

## 2017-09-03 ENCOUNTER — Ambulatory Visit
Admission: RE | Admit: 2017-09-03 | Discharge: 2017-09-03 | Disposition: A | Discharge status: Home / Self Care | Payer: Disability Insurance | Source: Ambulatory Visit | Attending: Thoracic Surgery | Admitting: Thoracic Surgery

## 2017-09-03 ENCOUNTER — Other Ambulatory Visit: Payer: Self-pay | Admitting: *Deleted

## 2017-09-03 DIAGNOSIS — M19071 Primary osteoarthritis, right ankle and foot: Secondary | ICD-10-CM | POA: Diagnosis not present

## 2017-09-03 DIAGNOSIS — T148XXA Other injury of unspecified body region, initial encounter: Secondary | ICD-10-CM

## 2017-09-03 DIAGNOSIS — M21371 Foot drop, right foot: Secondary | ICD-10-CM | POA: Diagnosis present

## 2017-11-12 ENCOUNTER — Ambulatory Visit: Payer: Medicare Other | Admitting: Podiatry

## 2017-12-13 ENCOUNTER — Ambulatory Visit (INDEPENDENT_AMBULATORY_CARE_PROVIDER_SITE_OTHER): Payer: Medicare Other | Admitting: Podiatry

## 2017-12-13 ENCOUNTER — Encounter: Payer: Self-pay | Admitting: Podiatry

## 2017-12-13 DIAGNOSIS — M79674 Pain in right toe(s): Secondary | ICD-10-CM

## 2017-12-13 DIAGNOSIS — L84 Corns and callosities: Secondary | ICD-10-CM | POA: Diagnosis not present

## 2017-12-13 DIAGNOSIS — B351 Tinea unguium: Secondary | ICD-10-CM | POA: Diagnosis not present

## 2017-12-13 NOTE — Progress Notes (Signed)
This patient presents the office follow-up for an evaluation of painful callus on his right heel and treatment of thick overgrown big toenail left foot.  He says the toenail has become thick and disfigured and he is unable to self treat.  He also says he has a painful callus on the bottom of the right heel.  General Appearance  Alert, conversant and in no acute stress.  Vascular  Dorsalis pedis and posterior pulses are palpable  bilaterally.  Capillary return is within normal limits  Bilaterally. Temperature is within normal limits  Bilaterally  Neurologic  Senn-Weinstein monofilament wire test diminished  bilaterally. Muscle power  Within normal limits bilaterally.  Nails Thick disfigured discolored nails with subungual debride bilaterally from hallux left foot. No evidence of bacterial infection or drainage bilaterally.  Orthopedic  No limitations of motion of motion feet bilaterally.  No crepitus or effusions noted.  No bony pathology or digital deformities noted. Hammer toe contractures noted on the fifth digits bilaterally  Skin  Porokeratosis sub 2 resolved.  There is a painful corn noted on the distal tip of the fifth toe of the right foot.    There is also callus noted on the plantar  the right heel. No signs of infections or ulcers noted  Porokeratosis sub heel right foot.  Thick disfigured hallux toenail left foot.   Debridement of porokeratotic lesions, right foot.  Debridement of nail left hallux.  Discussed this thick nail with this patient.  I recommended he allow me to treat him  conservatively  Today  and if he desires in the future nail surgery can be performed for the permanent removal of left great toenail.  Return to clinic prn  for further evaluation and treatment   Helane Gunther DPM

## 2018-07-25 IMAGING — CR DG FOOT 2V*R*
1 series · 2 of 2 positions shown · non-contrast
Comparison: 01/05/2015 outside plain film exam.

CLINICAL DATA: 36-year-old male with prior surgery 17 years ago
with right ankle fusion. Initial encounter.

EXAM:
RIGHT FOOT - 2 VIEW

[Series 1: dg foot 2 views right · 0.14mm/px · 2 of 2 slices shown]
[im 1/2]
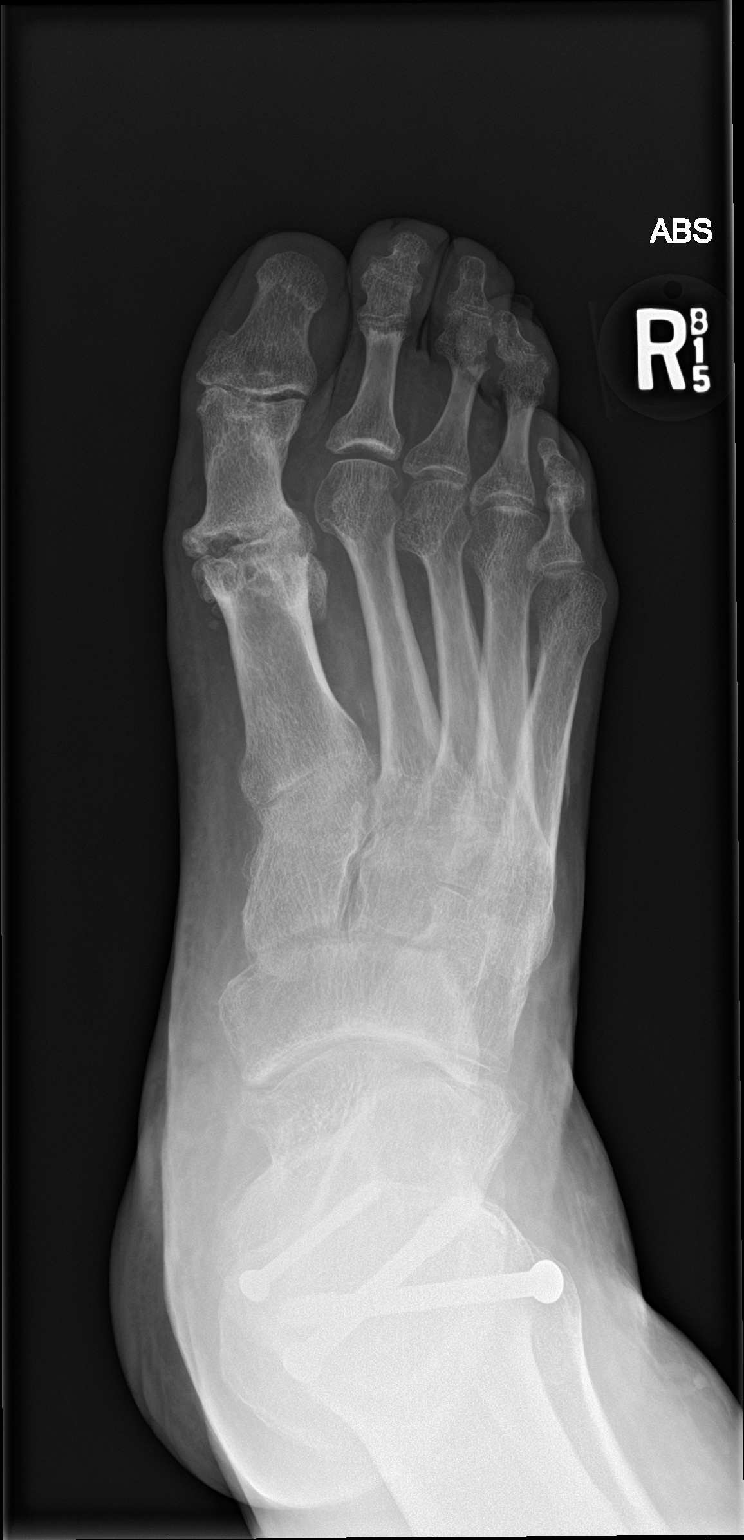
[im 2/2]
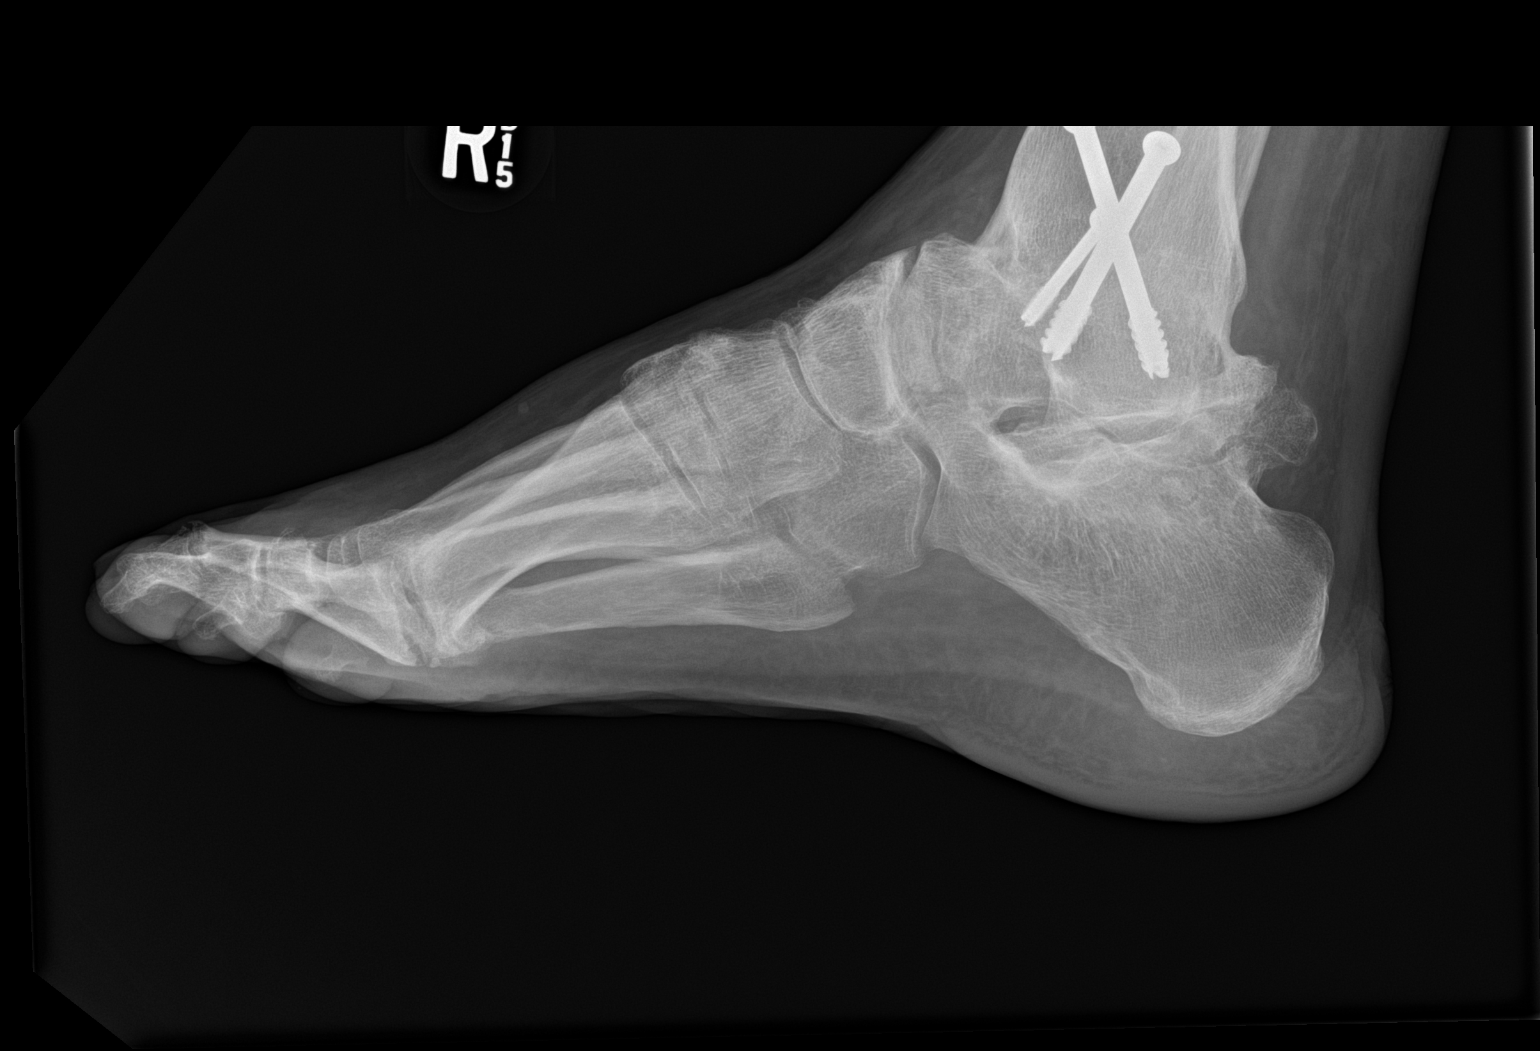

[2 of 2 positions shown; findings below may reference images not displayed]

FINDINGS: Post surgery with 3 screws traversing fused right tibiotalar joint.

Fusion anterior calcaneal talar joint. Bony overgrowth posterior
calcaneal talar joint.

Degenerative changes talonavicular articulation.

Degenerative changes first metatarsal phalangeal joint space with
erosion or surgical resection of the right first metatarsal head.

Possible fusion right third, fourth and fifth proximal
interphalangeal joint space.

Mild subluxation right fifth metatarsal phalangeal articulation.
IMPRESSION: Post surgery with 3 screws traversing fused right tibiotalar joint.

Fusion anterior calcaneal talar joint. Bony overgrowth posterior
calcaneal talar joint.

Degenerative changes talonavicular articulation.

Erosion or surgical fusion of right first metatarsal head.
Degenerative changes first metatarsal phalangeal joint space.

Possible fusion right third, fourth and fifth proximal
interphalangeal joint space.

Mild subluxation right fifth metatarsal phalangeal articulation.

## 2019-01-26 ENCOUNTER — Inpatient Hospital Stay
Admit: 2019-01-26 | Discharge: 2019-01-26 | Disposition: A | Discharge status: Home / Self Care | Payer: MEDICARE | Attending: Emergency Medicine

## 2019-01-26 DIAGNOSIS — L97519 Non-pressure chronic ulcer of other part of right foot with unspecified severity: Secondary | ICD-10-CM

## 2019-01-26 LAB — CBC WITH AUTO DIFFERENTIAL
Basophils %: 0 % (ref 0.0–2.0)
Basophils Absolute: 0.1 10*3/uL (ref 0.0–0.2)
Eosinophils %: 1 % (ref 0.5–7.8)
Eosinophils Absolute: 0.2 10*3/uL (ref 0.0–0.8)
Granulocyte Absolute Count: 0.2 10*3/uL (ref 0.0–0.5)
Hematocrit: 39.9 % — ABNORMAL LOW (ref 41.1–50.3)
Hemoglobin: 13.3 g/dL — ABNORMAL LOW (ref 13.6–17.2)
Immature Granulocytes: 1 % (ref 0.0–5.0)
Lymphocytes %: 14 % (ref 13–44)
Lymphocytes Absolute: 1.9 10*3/uL (ref 0.5–4.6)
MCH: 31.1 PG (ref 26.1–32.9)
MCHC: 33.3 g/dL (ref 31.4–35.0)
MCV: 93.2 FL (ref 79.6–97.8)
MPV: 9 FL — ABNORMAL LOW (ref 9.4–12.3)
Monocytes %: 9 % (ref 4.0–12.0)
Monocytes Absolute: 1.2 10*3/uL (ref 0.1–1.3)
NRBC Absolute: 0 10*3/uL (ref 0.0–0.2)
Neutrophils %: 75 % (ref 43–78)
Neutrophils Absolute: 10.7 10*3/uL — ABNORMAL HIGH (ref 1.7–8.2)
Platelets: 221 10*3/uL (ref 150–450)
RBC: 4.28 M/uL (ref 4.23–5.6)
RDW: 13.4 % (ref 11.9–14.6)
WBC: 14.2 10*3/uL — ABNORMAL HIGH (ref 4.3–11.1)

## 2019-01-26 LAB — BASIC METABOLIC PANEL
Anion Gap: 8 mmol/L (ref 7–16)
BUN: 13 MG/DL (ref 6–23)
CO2: 27 mmol/L (ref 21–32)
Calcium: 8.6 MG/DL (ref 8.3–10.4)
Chloride: 102 mmol/L (ref 98–107)
Creatinine: 1.03 MG/DL (ref 0.8–1.5)
EGFR IF NonAfrican American: >60 mL/min/{1.73_m2} (ref >60)
GFR African American: >60 mL/min/{1.73_m2} (ref >60)
Glucose: 109 mg/dL — ABNORMAL HIGH (ref 65–100)
Potassium: 3.7 mmol/L (ref 3.5–5.1)
Sodium: 137 mmol/L (ref 136–145)

## 2019-01-26 LAB — METABOLIC PANEL, BASIC
Anion gap: 8 mmol/L (ref 7–16)
BUN: 13 MG/DL (ref 6–23)
CO2: 27 mmol/L (ref 21–32)
Calcium: 8.6 MG/DL (ref 8.3–10.4)
Chloride: 102 mmol/L (ref 98–107)
Creatinine: 1.03 MG/DL (ref 0.8–1.5)
GFR est AA: >60 mL/min/{1.73_m2} (ref >60)
GFR est non-AA: >60 mL/min/{1.73_m2} (ref >60)
Glucose: 109 mg/dL — ABNORMAL HIGH (ref 65–100)
Potassium: 3.7 mmol/L (ref 3.5–5.1)
Sodium: 137 mmol/L (ref 136–145)

## 2019-01-26 LAB — CBC WITH AUTOMATED DIFF
ABS. BASOPHILS: 0.1 10*3/uL (ref 0.0–0.2)
ABS. EOSINOPHILS: 0.2 10*3/uL (ref 0.0–0.8)
ABS. IMM. GRANS.: 0.2 10*3/uL (ref 0.0–0.5)
ABS. LYMPHOCYTES: 1.9 10*3/uL (ref 0.5–4.6)
ABS. MONOCYTES: 1.2 10*3/uL (ref 0.1–1.3)
ABS. NEUTROPHILS: 10.7 10*3/uL — ABNORMAL HIGH (ref 1.7–8.2)
ABSOLUTE NRBC: 0 10*3/uL (ref 0.0–0.2)
BASOPHILS: 0 % (ref 0.0–2.0)
EOSINOPHILS: 1 % (ref 0.5–7.8)
HCT: 39.9 % — ABNORMAL LOW (ref 41.1–50.3)
HGB: 13.3 g/dL — ABNORMAL LOW (ref 13.6–17.2)
IMMATURE GRANULOCYTES: 1 % (ref 0.0–5.0)
LYMPHOCYTES: 14 % (ref 13–44)
MCH: 31.1 PG (ref 26.1–32.9)
MCHC: 33.3 g/dL (ref 31.4–35.0)
MCV: 93.2 FL (ref 79.6–97.8)
MONOCYTES: 9 % (ref 4.0–12.0)
MPV: 9 FL — ABNORMAL LOW (ref 9.4–12.3)
NEUTROPHILS: 75 % (ref 43–78)
PLATELET: 221 10*3/uL (ref 150–450)
RBC: 4.28 M/uL (ref 4.23–5.6)
RDW: 13.4 % (ref 11.9–14.6)
WBC: 14.2 10*3/uL — ABNORMAL HIGH (ref 4.3–11.1)

## 2019-01-26 MED ORDER — VANCOMYCIN IN 0.9 % SODIUM CHLORIDE 2 GRAM/500 ML IV
2 gram/500 mL | Freq: Once | INTRAVENOUS | Status: AC
Start: 2019-01-26 — End: 2019-01-26
  Administered 2019-01-26: 09:00:00 via INTRAVENOUS

## 2019-01-26 MED ORDER — VANCOMYCIN 1,000 MG IV SOLR
1000 mg | INTRAVENOUS | Status: DC
Start: 2019-01-26 — End: 2019-01-26
  Administered 2019-01-26: 08:00:00 via INTRAVENOUS

## 2019-01-26 MED ORDER — CLINDAMYCIN 300 MG CAP
300 mg | ORAL_CAPSULE | Freq: Four times a day (QID) | ORAL | 0 refills | Status: AC
Start: 2019-01-26 — End: 2019-02-02

## 2019-01-26 MED FILL — VANCOMYCIN 10 GRAM IV SOLR: 10 gram | INTRAVENOUS | Qty: 2000

## 2019-01-26 NOTE — ED Notes (Signed)
Patient refused crutches. Wet to dry dressing applied right foot and patient given new socks

## 2019-01-26 NOTE — ED Triage Notes (Signed)
EMS: Pt arriving from [facility] on [address] via [facility]. Called out for leg swelling. Swelling note to right leg. PT sts he was seen at a facility recently and tested for MRSA and is still waiting on his results.WAS prescribed antibiotics, had them filled, and lost them. PT requesting antibiotics and MRSA  Results.

## 2019-01-26 NOTE — ED Notes (Signed)
Arrives with face mask in place. Arrives via [facility] from [facility] on [address]. Reports right foot pain with open wound to heel, noted drainage on arrival. States initially formed from work boots from [employer]. Swelling to foot extending up leg to knee. Reports chills, denies fevers. Seen multiple times recently at [facility] for same, last admission 6/23 for osteomyelitis. Reports compliance with abx prescribed.

## 2019-01-26 NOTE — ED Provider Notes (Addendum)
38 year old male complaint of right leg swelling and pain.  Patient was recently at [facility] and diagnosed with cellulitis of the right lower extremity was started on antibiotics but did not take them because he "lost them" the leg is not getting any better.    The history is provided by the patient.   Leg Pain    This is a recurrent problem. The current episode started more than 1 week ago. The problem occurs constantly. The problem has not changed since onset.The pain is present in the right lower leg. The pain is moderate. He has tried nothing for the symptoms. There has been no history of extremity trauma.        No past medical history on file.    No past surgical history on file.      No family history on file.    Social History     Socioeconomic History   ??? Marital status: SINGLE     Spouse name: Not on file   ??? Number of children: Not on file   ??? Years of education: Not on file   ??? Highest education level: Not on file   Occupational History   ??? Not on file   Social Needs   ??? Financial resource strain: Not on file   ??? Food insecurity     Worry: Not on file     Inability: Not on file   ??? Transportation needs     Medical: Not on file     Non-medical: Not on file   Tobacco Use   ??? Smoking status: Not on file   Substance and Sexual Activity   ??? Alcohol use: Not on file   ??? Drug use: Not on file   ??? Sexual activity: Not on file   Lifestyle   ??? Physical activity     Days per week: Not on file     Minutes per session: Not on file   ??? Stress: Not on file   Relationships   ??? Social Product manager on phone: Not on file     Gets together: Not on file     Attends religious service: Not on file     Active member of club or organization: Not on file     Attends meetings of clubs or organizations: Not on file     Relationship status: Not on file   ??? Intimate partner violence     Fear of current or ex partner: Not on file     Emotionally abused: Not on file     Physically abused: Not on file      Forced sexual activity: Not on file   Other Topics Concern   ??? Not on file   Social History Narrative   ??? Not on file         ALLERGIES: Patient has no allergy information on record.    Review of Systems   Constitutional: Negative.  Negative for activity change.   HENT: Negative.    Eyes: Negative.    Respiratory: Negative.    Cardiovascular: Negative.    Gastrointestinal: Negative.    Genitourinary: Negative.    Musculoskeletal: Negative.    Skin: Negative.    Neurological: Negative.    Psychiatric/Behavioral: Negative.    All other systems reviewed and are negative.      There were no vitals filed for this visit.         Physical Exam  Vitals signs and  nursing note reviewed.   Constitutional:       General: He is not in acute distress.     Appearance: He is well-developed. He is not diaphoretic.   HENT:      Head: Normocephalic and atraumatic.      Right Ear: External ear normal.      Left Ear: External ear normal.      Nose: Nose normal.      Mouth/Throat:      Pharynx: No oropharyngeal exudate.   Eyes:      General: No scleral icterus.        Right eye: No discharge.         Left eye: No discharge.      Conjunctiva/sclera: Conjunctivae normal.      Pupils: Pupils are equal, round, and reactive to light.   Neck:      Musculoskeletal: Normal range of motion and neck supple.      Vascular: No JVD.      Trachea: No tracheal deviation.   Cardiovascular:      Rate and Rhythm: Normal rate and regular rhythm.   Pulmonary:      Effort: Pulmonary effort is normal. No respiratory distress.      Breath sounds: Normal breath sounds. No stridor. No wheezing.   Chest:      Chest wall: No tenderness.   Abdominal:      General: Bowel sounds are normal. There is no distension.      Palpations: Abdomen is soft. There is no mass.      Tenderness: There is no abdominal tenderness.   Musculoskeletal: Normal range of motion.      Right lower leg: He exhibits tenderness and swelling.   Skin:     General: Skin is warm and dry.       Coloration: Skin is not pale.      Findings: No erythema or rash.   Neurological:      Mental Status: He is alert and oriented to person, place, and time.      Cranial Nerves: No cranial nerve deficit.   Psychiatric:         Behavior: Behavior normal.         Thought Content: Thought content normal.          MDM  Number of Diagnoses or Management Options  Ulcer of foot, chronic, right, with unspecified severity [facility](HCC):   Diagnosis management comments: Patient is very vague about his past medical history but he does state that he knows he has a right lower leg DVT been [facility] at [facility].  He also is noncompliant with his antibiotics.  He has not followed up as planned.  Patient states he is getting ready to leave town tomorrow to go back to [place] or to [place].  He will be given referral to the wound care center which he states that he may stay in town to go to that wound care center.  However he is not sure how long he is going to be in town.  He was advised that he continues to neglect this leg that most likely he is going to have to have it amputated.  Patient will be given crutches foot new dressing applied.       Amount and/or Complexity of Data Reviewed  Clinical lab tests: ordered and reviewed  Tests in the radiology section of CPT??: ordered and reviewed  Tests in the medicine section of CPT??: ordered and reviewed  Risk of Complications, Morbidity, and/or Mortality  Presenting problems: high  Diagnostic procedures: high  Management options: high           Procedures

## 2019-01-26 NOTE — ED Triage Notes (Signed)
Arrives with face mask in place. Arrives via [facility] from [facility] on [address]. Reports right foot pain with open wound to heel, noted drainage on arrival. States initially formed from work boots from [employer]. Swelling to foot extending up leg to knee. Reports chills, denies fevers. Seen multiple times recently at [facility] for same, last admission 6/23 for osteomyelitis. Reports compliance with abx prescribed.

## 2019-01-26 NOTE — ED Provider Notes (Signed)
38 year old male complaint of right leg swelling and pain.  Patient was recently at [facility] and diagnosed with cellulitis of the right lower extremity was started on antibiotics but did not take them because he "lost them" the leg is not getting any better.    The history is provided by the patient.   Leg Pain    This is a recurrent problem. The current episode started more than 1 week ago. The problem occurs constantly. The problem has not changed since onset.The pain is present in the right lower leg. The pain is moderate. He has tried nothing for the symptoms. There has been no history of extremity trauma.        No past medical history on file.    No past surgical history on file.      No family history on file.    Social History     Socioeconomic History   ??? Marital status: SINGLE     Spouse name: Not on file   ??? Number of children: Not on file   ??? Years of education: Not on file   ??? Highest education level: Not on file   Occupational History   ??? Not on file   Social Needs   ??? Financial resource strain: Not on file   ??? Food insecurity     Worry: Not on file     Inability: Not on file   ??? Transportation needs     Medical: Not on file     Non-medical: Not on file   Tobacco Use   ??? Smoking status: Not on file   Substance and Sexual Activity   ??? Alcohol use: Not on file   ??? Drug use: Not on file   ??? Sexual activity: Not on file   Lifestyle   ??? Physical activity     Days per week: Not on file     Minutes per session: Not on file   ??? Stress: Not on file   Relationships   ??? Social Product manager on phone: Not on file     Gets together: Not on file     Attends religious service: Not on file     Active member of club or organization: Not on file     Attends meetings of clubs or organizations: Not on file     Relationship status: Not on file   ??? Intimate partner violence     Fear of current or ex partner: Not on file     Emotionally abused: Not on file     Physically abused: Not on file     Forced sexual  activity: Not on file   Other Topics Concern   ??? Not on file   Social History Narrative   ??? Not on file         ALLERGIES: Patient has no allergy information on record.    Review of Systems   Constitutional: Negative.  Negative for activity change.   HENT: Negative.    Eyes: Negative.    Respiratory: Negative.    Cardiovascular: Negative.    Gastrointestinal: Negative.    Genitourinary: Negative.    Musculoskeletal: Negative.    Skin: Negative.    Neurological: Negative.    Psychiatric/Behavioral: Negative.    All other systems reviewed and are negative.      There were no vitals filed for this visit.         Physical Exam  Vitals signs and  nursing note reviewed.   Constitutional:       General: He is not in acute distress.     Appearance: He is well-developed. He is not diaphoretic.   HENT:      Head: Normocephalic and atraumatic.      Right Ear: External ear normal.      Left Ear: External ear normal.      Nose: Nose normal.      Mouth/Throat:      Pharynx: No oropharyngeal exudate.   Eyes:      General: No scleral icterus.        Right eye: No discharge.         Left eye: No discharge.      Conjunctiva/sclera: Conjunctivae normal.      Pupils: Pupils are equal, round, and reactive to light.   Neck:      Musculoskeletal: Normal range of motion and neck supple.      Vascular: No JVD.      Trachea: No tracheal deviation.   Cardiovascular:      Rate and Rhythm: Normal rate and regular rhythm.   Pulmonary:      Effort: Pulmonary effort is normal. No respiratory distress.      Breath sounds: Normal breath sounds. No stridor. No wheezing.   Chest:      Chest wall: No tenderness.   Abdominal:      General: Bowel sounds are normal. There is no distension.      Palpations: Abdomen is soft. There is no mass.      Tenderness: There is no abdominal tenderness.   Musculoskeletal: Normal range of motion.      Right lower leg: He exhibits tenderness and swelling.   Skin:     General: Skin is warm and dry.      Coloration: Skin  is not pale.      Findings: No erythema or rash.   Neurological:      Mental Status: He is alert and oriented to person, place, and time.      Cranial Nerves: No cranial nerve deficit.   Psychiatric:         Behavior: Behavior normal.         Thought Content: Thought content normal.          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  He also is noncompliant with his antibiotics.  He has not followed up as planned.  Patient states he is getting ready to leave town tomorrow to go back to [place] or to [place].  He will be given referral to the wound care center which he states that he may stay in town to go to that wound care center.  However he is not sure how long he is going to be in town.  He was advised that he continues to neglect this leg that most likely he is going to have to have it amputated.  Patient will be given crutches foot new dressing applied.       Amount and/or Complexity of Data Reviewed  Clinical lab tests: ordered and reviewed  Tests in the radiology section of CPT??: ordered and reviewed  Tests in the medicine section of CPT??: ordered and reviewed  Risk of Complications, Morbidity, and/or Mortality  Presenting problems: high  Diagnostic procedures: high  Management options: high           Procedures

## 2019-01-26 NOTE — ED Notes (Signed)
Patient refused crutches. Wet to dry dressing applied right foot and patient given new socks

## 2019-01-26 NOTE — ED Notes (Signed)
EMS: Pt arriving from [facility] on [address] via [facility]. Called out for leg swelling. Swelling note to right leg. PT sts he was seen at a facility recently and tested for MRSA and is still waiting on his results.WAS prescribed antibiotics, had them filled, and lost them. PT requesting antibiotics and MRSA  Results.

## 2019-01-31 LAB — CULTURE, BLOOD 1
Culture: NO GROWTH
Culture: NO GROWTH

## 2019-01-31 LAB — CULTURE, BLOOD
Culture result:: NO GROWTH
Culture result:: NO GROWTH
